# Patient Record
Sex: Male | Born: 1995 | Race: Black or African American | Hispanic: No | Marital: Single | Smoking: Never smoker
Health system: Southern US, Community
[De-identification: ages and names within clinical notes are randomized; demographics above are authoritative.]

## PROBLEM LIST (undated history)

## (undated) DIAGNOSIS — R440 Auditory hallucinations: Secondary | ICD-10-CM

---

## 2018-02-27 ENCOUNTER — Emergency Department (HOSPITAL_COMMUNITY)
Admission: EM | Admit: 2018-02-27 | Discharge: 2018-02-28 | Disposition: A | Discharge status: Home / Self Care | Payer: Self-pay | Attending: Emergency Medicine | Admitting: Emergency Medicine

## 2018-02-27 ENCOUNTER — Encounter (HOSPITAL_COMMUNITY): Payer: Self-pay | Admitting: Emergency Medicine

## 2018-02-27 ENCOUNTER — Emergency Department (HOSPITAL_COMMUNITY): Payer: Self-pay

## 2018-02-27 ENCOUNTER — Other Ambulatory Visit: Payer: Self-pay

## 2018-02-27 DIAGNOSIS — F12259 Cannabis dependence with psychotic disorder, unspecified: Secondary | ICD-10-CM | POA: Insufficient documentation

## 2018-02-27 DIAGNOSIS — F4321 Adjustment disorder with depressed mood: Secondary | ICD-10-CM | POA: Insufficient documentation

## 2018-02-27 DIAGNOSIS — R440 Auditory hallucinations: Secondary | ICD-10-CM | POA: Insufficient documentation

## 2018-02-27 DIAGNOSIS — R4689 Other symptoms and signs involving appearance and behavior: Secondary | ICD-10-CM

## 2018-02-27 LAB — BASIC METABOLIC PANEL
Anion gap: 8 (ref 5–15)
BUN: 10 mg/dL (ref 6–20)
CO2: 22 mmol/L (ref 22–32)
Calcium: 9.6 mg/dL (ref 8.9–10.3)
Chloride: 106 mmol/L (ref 98–111)
Creatinine, Ser: 1.16 mg/dL (ref 0.61–1.24)
GFR calc Af Amer: >60 mL/min (ref >60)
GFR calc non Af Amer: >60 mL/min (ref >60)
Glucose, Bld: 122 mg/dL — ABNORMAL HIGH (ref 70–99)
Potassium: 3.7 mmol/L (ref 3.5–5.1)
Sodium: 136 mmol/L (ref 135–145)

## 2018-02-27 LAB — CBC WITH DIFFERENTIAL/PLATELET
Abs Immature Granulocytes: 0.04 10*3/uL (ref 0.00–0.07)
Basophils Absolute: 0.1 10*3/uL (ref 0.0–0.1)
Basophils Relative: 1 %
Eosinophils Absolute: 0 10*3/uL (ref 0.0–0.5)
Eosinophils Relative: 0 %
HEMATOCRIT: 45.4 % (ref 39.0–52.0)
Hemoglobin: 15.1 g/dL (ref 13.0–17.0)
Immature Granulocytes: 0 %
LYMPHS ABS: 1.4 10*3/uL (ref 0.7–4.0)
Lymphocytes Relative: 14 %
MCH: 28.3 pg (ref 26.0–34.0)
MCHC: 33.3 g/dL (ref 30.0–36.0)
MCV: 85 fL (ref 80.0–100.0)
Monocytes Absolute: 0.5 10*3/uL (ref 0.1–1.0)
Monocytes Relative: 5 %
Neutro Abs: 7.6 10*3/uL (ref 1.7–7.7)
Neutrophils Relative %: 80 %
Platelets: 275 10*3/uL (ref 150–400)
RBC: 5.34 MIL/uL (ref 4.22–5.81)
RDW: 13.2 % (ref 11.5–15.5)
WBC: 9.6 10*3/uL (ref 4.0–10.5)
nRBC: 0 % (ref 0.0–0.2)

## 2018-02-27 LAB — ETHANOL: Alcohol, Ethyl (B): <10 mg/dL (ref <10)
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
## 2018-02-27 NOTE — ED Notes (Signed)
Patient receiving TTS at this time.

## 2018-02-27 NOTE — ED Provider Notes (Signed)
[facility] EMERGENCY DEPARTMENT Provider Note   CSN: [id number] Arrival date & time: 02/27/18  2126     History   Chief Complaint Chief Complaint  Patient presents with  . Altered Mental Status    HPI Adrian Hawkins is a 22 y.o. male.  HPI It is very difficult to get a clear history out of this patient.  Patient initially stated that he felt like he was going crazy.  He has a difficult time elaborating.  Patient states he does use marijuana but no other drugs.  His father wonders whether he may have been slipped something.  I asked the patient if he is hearing any voices after very long pauses he said yes.  When I asked them what the voices are saying the patient stated "believe".  He did not admit to having any thoughts of suicide or homicide.  He could not really answer whether he was feeling depressed.  Patient does not have any history of mental health problems. History reviewed. No pertinent past medical history.  There are no active problems to display for this patient.   History reviewed. No pertinent surgical history.      Home Medications    Prior to Admission medications   Not on File    Family History No family history on file.  Social History Social History   Tobacco Use  . Smoking status: Never Smoker  . Smokeless tobacco: Never Used  Substance Use Topics  . Alcohol use: Yes    Comment: Occasionally   . Drug use: Yes    Types: Marijuana     Allergies   Patient has no allergy information on record.   Review of Systems Review of Systems  All other systems reviewed and are negative.    Physical Exam Updated Vital Signs BP (!) 160/100   Pulse (!) 114   Temp (!) 97.2 F (36.2 C)   Resp (!) 21   Ht 1.753 m (5\' 9" )   Wt 99.8 kg   SpO2 98%   BMI 32.49 kg/m   Physical Exam Vitals signs and nursing note reviewed.  Constitutional:      General: He is not in acute distress.    Appearance: He is well-developed.  HENT:     Head:  Normocephalic and atraumatic.     Right Ear: External ear normal.     Left Ear: External ear normal.  Eyes:     General: No scleral icterus.       Right eye: No discharge.        Left eye: No discharge.     Conjunctiva/sclera: Conjunctivae normal.  Neck:     Musculoskeletal: Neck supple.     Trachea: No tracheal deviation.  Cardiovascular:     Rate and Rhythm: Normal rate and regular rhythm.  Pulmonary:     Effort: Pulmonary effort is normal. No respiratory distress.     Breath sounds: Normal breath sounds. No stridor. No wheezing or rales.  Abdominal:     General: Bowel sounds are normal. There is no distension.     Palpations: Abdomen is soft.     Tenderness: There is no abdominal tenderness. There is no guarding or rebound.  Musculoskeletal:        General: No tenderness.  Skin:    General: Skin is warm and dry.     Findings: No rash.  Neurological:     Mental Status: He is alert.     Cranial Nerves: No cranial nerve deficit (  no facial droop, extraocular movements intact, no slurred speech).     Sensory: No sensory deficit.     Motor: No abnormal muscle tone or seizure activity.     Coordination: Coordination normal.  Psychiatric:        Attention and Perception: He is inattentive.        Mood and Affect: Affect is blunt.        Speech: He is noncommunicative. Speech is delayed.        Behavior: Behavior is slowed and withdrawn.      ED Treatments / Results  Labs (all labs ordered are listed, but only abnormal results are displayed) Labs Reviewed  BASIC METABOLIC PANEL - Abnormal; Notable for the following components:      Result Value   Glucose, Bld 122 (*)    All other components within normal limits  CBC WITH DIFFERENTIAL/PLATELET  ETHANOL  RAPID URINE DRUG SCREEN, HOSP PERFORMED     Radiology pending  Procedures Procedures (including critical care time)  Medications Ordered in ED Medications - No data to display   Initial Impression / Assessment  and Plan / ED Course  I have reviewed the triage vital signs and the nursing notes.  Pertinent labs & imaging results that were available during my care of the patient were reviewed by me and considered in my medical decision making (see chart for details).   Patient presents to the ED for an acute change in his behavior.  Patient is very slow to answer any questions.  Very difficult to get any history out of him.  Question whether he is having any auditory hallucinations and is responding to internal stimuli.  Initial labs are reassuring.  Head CT is pending.  I have requested a psychiatric consultation.  Final Clinical Impressions(s) / ED Diagnoses   Final diagnoses:  Auditory hallucination  Abnormal behavior      Linwood DibblesKnapp, Jacier Gladu, MD 02/28/18 0010

## 2018-02-28 ENCOUNTER — Other Ambulatory Visit: Payer: Self-pay

## 2018-02-28 ENCOUNTER — Encounter (HOSPITAL_COMMUNITY): Payer: Self-pay | Admitting: Registered Nurse

## 2018-02-28 ENCOUNTER — Encounter (HOSPITAL_COMMUNITY): Payer: Self-pay | Admitting: *Deleted

## 2018-02-28 ENCOUNTER — Inpatient Hospital Stay (HOSPITAL_COMMUNITY)
Admission: AD | Admit: 2018-02-28 | Discharge: 2018-03-03 | DRG: 897 | Disposition: A | Discharge status: Home / Self Care | Payer: Federal, State, Local not specified - Other | Attending: Psychiatry | Admitting: Psychiatry

## 2018-02-28 ENCOUNTER — Other Ambulatory Visit: Payer: Self-pay | Admitting: Registered Nurse

## 2018-02-28 DIAGNOSIS — F12259 Cannabis dependence with psychotic disorder, unspecified: Secondary | ICD-10-CM | POA: Diagnosis present

## 2018-02-28 DIAGNOSIS — F329 Major depressive disorder, single episode, unspecified: Secondary | ICD-10-CM | POA: Diagnosis present

## 2018-02-28 DIAGNOSIS — E250 Congenital adrenogenital disorders associated with enzyme deficiency: Secondary | ICD-10-CM | POA: Diagnosis present

## 2018-02-28 DIAGNOSIS — F2081 Schizophreniform disorder: Secondary | ICD-10-CM | POA: Diagnosis not present

## 2018-02-28 DIAGNOSIS — F29 Unspecified psychosis not due to a substance or known physiological condition: Secondary | ICD-10-CM | POA: Diagnosis present

## 2018-02-28 LAB — RAPID URINE DRUG SCREEN, HOSP PERFORMED
Amphetamines: NOT DETECTED
Barbiturates: NOT DETECTED
Benzodiazepines: NOT DETECTED
Cocaine: NOT DETECTED
Opiates: NOT DETECTED
Tetrahydrocannabinol: POSITIVE — AB

## 2018-02-28 MED ORDER — ZIPRASIDONE MESYLATE 20 MG IM SOLR
20.0000 mg | INTRAMUSCULAR | Status: AC | PRN
  Administered 2018-03-01: 20 mg via INTRAMUSCULAR
  Filled 2018-02-28: qty 20

## 2018-02-28 MED ORDER — OLANZAPINE 5 MG PO TBDP
10.0000 mg | ORAL_TABLET | Freq: Three times a day (TID) | ORAL | Status: DC | PRN
  Filled 2018-02-28: qty 2

## 2018-02-28 MED ORDER — LORAZEPAM 1 MG PO TABS
1.0000 mg | ORAL_TABLET | ORAL | Status: DC | PRN
  Filled 2018-02-28: qty 1

## 2018-02-28 MED ORDER — STERILE WATER FOR INJECTION IJ SOLN
INTRAMUSCULAR | Status: AC
  Filled 2018-02-28: qty 10

## 2018-02-28 MED ORDER — OLANZAPINE 5 MG PO TBDP: 5.0000 mg | ORAL_TABLET | Freq: Three times a day (TID) | ORAL | Status: DC | PRN

## 2018-02-28 MED ORDER — LORAZEPAM 1 MG PO TABS: 1.0000 mg | ORAL_TABLET | ORAL | Status: DC | PRN

## 2018-02-28 MED ORDER — ZIPRASIDONE MESYLATE 20 MG IM SOLR
20.0000 mg | Freq: Once | INTRAMUSCULAR | Status: AC
  Administered 2018-02-28: 20 mg via INTRAMUSCULAR
  Filled 2018-02-28: qty 20

## 2018-02-28 MED ORDER — OLANZAPINE 5 MG PO TBDP: 5.0000 mg | ORAL_TABLET | Freq: Every day | ORAL | Status: DC
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
## 2018-02-28 NOTE — ED Notes (Signed)
IVC papers served at this time by [employer].

## 2018-02-28 NOTE — ED Notes (Signed)
Have notified mother that she can talk with for a few mins

## 2018-02-28 NOTE — ED Notes (Signed)
EKG given to Dr

## 2018-02-28 NOTE — ED Provider Notes (Addendum)
9:30 AM Patient is agitated and seems to be progressively worsening. Will try po meds (ativan, zyprexa).  10:00 AM Seems to be getting worse, won't take PO, now much more aggressive. Will give IM geodon  10:24 AM Psychiatry recommends inpatient treatment.  They also recommend Zyprexa 5 mg disintegrating tablet or IM at night.   Pricilla LovelessGoldston, Hakeem Frazzini, MD 02/28/18 1025

## 2018-02-28 NOTE — ED Notes (Signed)
Pt now in custody of [employer]
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
## 2018-02-28 NOTE — ED Notes (Addendum)
Pt is tearful at nurses station. [employer] at bedside as well as security. States he wants to talk to his girlfriend

## 2018-02-28 NOTE — ED Notes (Signed)
[employer] has been called

## 2018-02-28 NOTE — ED Notes (Signed)
[phone] Mother

## 2018-02-28 NOTE — ED Notes (Signed)
Pt made his first 5 minute phone call at this time.

## 2018-02-28 NOTE — ED Notes (Signed)
Per NP, pt is going to be inpatient

## 2018-02-28 NOTE — Progress Notes (Signed)
Pt. meets criteria for inpatient treatment per Assunta FoundShuvon Rankin, NP.  Ref[facility] out to the following hospitals:  [facility]  [facility]  [facility]  [facility]  [facility]  [facility]     Disposition CSW will continue to follow for placement.  Timmothy EulerJean T. Kaylyn LimSutter, MSW, LCSWA Disposition Clinical Social Work [phone] (cell) [phone] (office)

## 2018-02-28 NOTE — ED Notes (Signed)
Officers, PD, and Security at bedside. Pt has finally deescalated

## 2018-02-28 NOTE — Progress Notes (Signed)
[facility]  Shuvon Rankin, NP is the accepting provider.  Malvin JohnsBrian Farah, MD is the attending provider.  Call report to [phone]  Eboni @ [facility] notified.   Pt is IVC .  Pt may be transported by [employer] Pt [facility]  [facility] 14[facility]00  PLE[facility] [phone]  Timmothy EulerJean T. Kaylyn LimSutter, MSW, LCSWA Disposition Clinical Social Work [phone] (cell) [phone] (office)

## 2018-02-28 NOTE — Tx Team (Signed)
Initial Treatment Plan 02/28/2018 7:34 PM Adrian Hawkins [id number]    PATIENT STRESSORS: Marital or family conflict Substance abuse   PATIENT STRENGTHS: Ability for insight Average or above average intelligence Communication skills General fund of knowledge Physical Health   PATIENT IDENTIFIED PROBLEMS: Anxiety "I'm here cause they think I'm crazy" "I get nervous a lot, I can feel my heart beating fast"                     DISCHARGE CRITERIA:  Ability to meet basic life and health needs Improved stabilization in mood, thinking, and/or behavior Verbal commitment to aftercare and medication compliance  PRELIMINARY DISCHARGE PLAN: Attend aftercare/continuing care group  PATIENT/FAMILY INVOLVEMENT: This treatment plan has been presented to and reviewed with the patient, Adrian Hawkins, and/or family member, .  The patient and family have been given the opportunity to ask questions and make suggestions.  Adrian Hawkins, [place], [place] 02/28/2018, 7:34 PM

## 2018-02-28 NOTE — ED Notes (Signed)
TTS in room.  

## 2018-02-28 NOTE — ED Notes (Signed)
Restraints taken off  

## 2018-02-28 NOTE — ED Notes (Signed)
Pt has now been moved to room 17. [employer] at bedside and they will be sitting him throughout the day.

## 2018-02-28 NOTE — ED Notes (Signed)
502 bed 2 Shuvon Pat Kocherankin  Farah MD  [phone]  IVC. Come at 2pm

## 2018-02-28 NOTE — Progress Notes (Signed)
Adult Psychoeducational Group Note  Date:  02/28/2018 Time:  9:00 PM  Group Topic/Focus:  Wrap-Up Group:   The focus of this group is to help patients review their daily goal of treatment and discuss progress on daily workbooks.  Participation Level:  Active  Participation Quality:  Appropriate  Affect:  Appropriate  Cognitive:  Appropriate  Insight: Appropriate  Engagement in Group:  Engaged  Modes of Intervention:  Discussion  Additional Comments: The patient expressed that he rates today a 10.The patient also said that he attended group.  Octavio Mannshigpen, Lynnet Hefley Lee 02/28/2018, 9:00 PM

## 2018-02-28 NOTE — Progress Notes (Signed)
Adrian Hawkins is a 22 year old male pt admitted on involuntary basis. On admission, he reports that he is here because they think I'm crazy and reports that his father brought him into the hospital. He denies SI/HI or A/V hallucinations and reports that he has never been suicidal. He does endorse anxiety and reports that he has anxiety problems. Adrian Hawkins was cooperative during admission process. He did endorse marijuana usage and occasional marijuana usage but denied any other substance use. He reports that he was living with his father but reports that he does not want to go back there when he is discharged. Adrian Hawkins was escorted to the unit, oriented to the milieu and safety maintained.

## 2018-02-28 NOTE — Progress Notes (Signed)
Nurse, Inetta Fermoina, informed of pt disposition due to pt nurse, Jacki ConesLaurie being unavailable.   AC, Kim informed of pt disposition.

## 2018-02-28 NOTE — Progress Notes (Signed)
Pt was observed in the dayroom, attending wrap-up group. Pt appears animated/anxious in affect and mood. Pt denies SI/HI/AVH/Pain at this time. PRNs offered; Pt declined. Pt was assertive with interaction. No behavioral issues noted. Pt was in dayroom seen interacting with peers. Support and encouragement offered. Will continue with POC.

## 2018-02-28 NOTE — BH Assessment (Signed)
Tele Assessment Note   Patient Name: Adrian Hawkins MRN: [id number] Referring Physician: Dr. Lynelle DoctorKnapp Location of Patient: [facility]  Adrian BueLarry Martinovich is an 22 y.o., single male. Pt presented to [facility] voluntarily, accompanied by his father Adrian Bue(Kelvin Rhatigan, [place] [phone]). Pt stated, "I'm here because they think I'm crazy. I feel like I'm crazy. I'm here to prove I'm not crazy." Pt was not truthful in answering the questions for the assessment, but his father contributed additional information regarding pt's current presentation. Per pt, he smokes marijuana daily, up to 5 blunts. Pt denied MH hx, hospitalizations and diagnosis. Pt stated that he has been feeling despondent, isolates, has lost interest in pleasurable things, feels guilt/worthless, has experienced tearfulness, and is angry/irritable. Pt reports no anxiety symptoms. Pt denied AH/VH. Pt father reports that pt laughs at inappropriate times, is often tearful and told him that "God talked to me." Pt father reports symptoms just today. Pt reports having stress related to his child's mother. Pt denies other major life events. Pt reports that he does not remember the events his father is discussing that has happened today. Pt reports having issues with memory.   Pt reports that he lives with his father and other immediate family. Pt reports being unemployed and having no income or insurance. Pt reports having one son. Pt reports no major medical issues. Pt denied legal hx and probation.   Pt oriented to person, place, time and situation. Pt presented alert, dressed appropriately and groomed. Pt spoke clearly, coherently, but seemed confused by questions at time. Pt could have just been guarded about answering, due to father giving a different report. Pt made good eye contact and answered questions appropriately. Pt presented euthymic, calm and mostly open to the assessment process. Pt presented with impairments  of recent memory.   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 No pertinent past medical history.  History reviewed. No pertinent surgical history.  Family History: No family history on file.  Social History:  reports that he has never smoked. He has never used smokeless tobacco. He reports current alcohol use. He reports current drug use. Drug: Marijuana.  Additional Social History:  Alcohol / Drug Use Pain Medications: SEE MAR.  Prescriptions: Pt denied current MH medications.  Over the Counter: SEE MAR.  History of alcohol / drug use?: Yes(THC; 17; Daily; 5 Blunts; Last use 02/27/2018)  CIWA: CIWA-Ar BP: (!) 160/100 Pulse Rate: (!) 114 COWS:    Allergies: Not on File  Home Medications: (Not in a hospital admission)   OB/GYN Status:  No LMP for male patient.  General Assessment Data Location of Assessment: [facility] TTS Assessment: In system Is this a Tele or Face-to-Face Assessment?: Tele Assessment Is this an Initial Assessment or a Re-assessment for this encounter?: Initial Assessment Patient Accompanied by:: Parent(Father- Adrian BueLarry Ottey [phone]) Language Other than English: No Living Arrangements: Other (Comment)(Pt reports that he lives with father, stepmother; sister.) What gender do you identify as?: Male Marital status: Single Maiden name: N/A Pregnancy Status: No Living Arrangements: Parent, Other relatives Can pt return to current living arrangement?: Yes Admission Status: Voluntary Is patient capable of signing voluntary admission?: Yes Referral Source: Self/Family/Friend Insurance type: Self Pay   Medical Screening Exam [facility] Medical Exam completed: Yes  Crisis Care Plan Living Arrangements: Parent, Other relatives Legal Guardian: Other:(Self) Name of Psychiatrist: Denied Name  of Therapist:  Denied  Education Status Is patient currently in school?: No Is the patient employed, unemployed or receiving disability?: Unemployed  Risk to self with the past 6 months Suicidal Ideation: No Has patient been a risk to self within the past 6 months prior to admission? : No Suicidal Intent: No Has patient had any suicidal intent within the past 6 months prior to admission? : No Is patient at risk for suicide?: No Suicidal Plan?: No Has patient had any suicidal plan within the past 6 months prior to admission? : No Access to Means: No What has been your use of drugs/alcohol within the last 12 months?: Denied Previous Attempts/Gestures: No How many times?: 0 Other Self Harm Risks: Denied Triggers for Past Attempts: None known Intentional Self Injurious Behavior: None Family Suicide History: No Recent stressful life event(s): (Denied) Persecutory voices/beliefs?: No Depression: Yes Depression Symptoms: Despondent, Tearfulness, Isolating, Loss of interest in usual pleasures, Feeling worthless/self pity, Feeling angry/irritable Substance abuse history and/or treatment for substance abuse?: No Suicide prevention information given to non-admitted patients: Not applicable  Risk to Others within the past 6 months Homicidal Ideation: No Does patient have any lifetime risk of violence toward others beyond the six months prior to admission? : No Thoughts of Harm to Others: No Current Homicidal Intent: No Current Homicidal Plan: No Access to Homicidal Means: No Identified Victim: Denied History of harm to others?: No Assessment of Violence: None Noted Violent Behavior Description: Denied Does patient have access to weapons?: No Criminal Charges Pending?: No Does patient have a court date: No Is patient on probation?: No  Psychosis Hallucinations: Auditory(Pt denies, but father states possibility. ) Delusions: None noted  Mental Status Report Appearance/Hygiene: Unremarkable Eye  Contact: Good Motor Activity: Unremarkable Speech: Logical/coherent Level of Consciousness: Alert Mood: Ashamed/humiliated Affect: Depressed Anxiety Level: None Thought Processes: Coherent, Relevant Judgement: Partial Orientation: Person, Place, Time, Situation, Appropriate for developmental age Obsessive Compulsive Thoughts/Behaviors: None  Cognitive Functioning Concentration: Normal Memory: Remote Intact, Recent Impaired Is patient IDD: No Insight: Poor Impulse Control: Poor Appetite: Good Have you had any weight changes? : No Change Sleep: No Change Total Hours of Sleep: 6 Vegetative Symptoms: None  ADLScreening [facility] Assessment Services) Patient's cognitive ability adequate to safely complete daily activities?: Yes Patient able to express need for assistance with ADLs?: Yes Independently performs ADLs?: Yes (appropriate for developmental age)  Prior Inpatient Therapy Prior Inpatient Therapy: No  Prior Outpatient Therapy Prior Outpatient Therapy: No Does patient have an ACCT team?: No Does patient have Intensive In-House Services?  : No Does patient have [facility] services? : No Does patient have P4CC services?: No  ADL Screening (condition at time of admission) Patient's cognitive ability adequate to safely complete daily activities?: Yes Is the patient deaf or have difficulty hearing?: No Does the patient have difficulty seeing, even when wearing glasses/contacts?: No Does the patient have difficulty concentrating, remembering, or making decisions?: No Patient able to express need for assistance with ADLs?: Yes Does the patient have difficulty dressing or bathing?: No Independently performs ADLs?: Yes (appropriate for developmental age) Does the patient have difficulty walking or climbing stairs?: No Weakness of Legs: None Weakness of Arms/Hands: None  Home Assistive Devices/Equipment Home Assistive Devices/Equipment: None  Therapy Consults (therapy consults  require a physician order) PT Evaluation Needed: No OT Evalulation Needed: No SLP Evaluation Needed: No Abuse/Neglect Assessment (Assessment to be complete while patient is alone) Abuse/Neglect Assessment Can Be Completed: Yes Physical Abuse: Denies Verbal Abuse: Denies Sexual Abuse:  Denies Exploitation of patient/patient's resources: Denies Self-Neglect: Denies Values / Beliefs Cultural Requests During Hospitalization: None Spiritual Requests During Hospitalization: None Consults Spiritual Care Consult Needed: No Social Work Consult Needed: No Merchant navy officer (For Healthcare) Does Patient Have a Medical Advance Directive?: No Would patient like information on creating a medical advance directive?: No - Patient declined          Disposition: Per Nira Conn, NP; Pt to be held overnight for monitoring of hallucinations. Pt to see Psychiatry in the morning for psychiatric clearance.  Disposition Initial Assessment Completed for this Encounter: Yes  This service was provided via telemedicine using a 2-way, interactive audio and video technology.  Names of all persons participating in this telemedicine service and their role in this encounter. Name: Antoine Vandermeulen, Montez Hageman Role: Patient   Name: Adrian Bue, Sr.  Role: Patient father.   Name: Chesley Noon  Role: Clinician   Name:  Role:    Chesley Noon, M.S., [facility], LCAS Triage Specialist [facility]  02/28/2018 12:03 AM

## 2018-03-01 DIAGNOSIS — F2081 Schizophreniform disorder: Secondary | ICD-10-CM

## 2018-03-01 MED ORDER — GABAPENTIN 400 MG PO CAPS
400.0000 mg | ORAL_CAPSULE | Freq: Three times a day (TID) | ORAL | Status: DC
  Administered 2018-03-02: 400 mg via ORAL
  Filled 2018-03-01 (×7): qty 1

## 2018-03-01 MED ORDER — LORAZEPAM 1 MG PO TABS
2.0000 mg | ORAL_TABLET | Freq: Once | ORAL | Status: AC
  Administered 2018-03-01: 2 mg via ORAL
  Filled 2018-03-01: qty 2

## 2018-03-01 MED ORDER — RISPERIDONE 2 MG PO TABS
4.0000 mg | ORAL_TABLET | Freq: Two times a day (BID) | ORAL | Status: DC
  Administered 2018-03-01 – 2018-03-03 (×5): 4 mg via ORAL
  Filled 2018-03-01 (×7): qty 2

## 2018-03-01 MED ORDER — OMEGA-3-ACID ETHYL ESTERS 1 G PO CAPS
1.0000 g | ORAL_CAPSULE | Freq: Two times a day (BID) | ORAL | Status: DC
  Administered 2018-03-02: 1 g via ORAL
  Filled 2018-03-01 (×8): qty 1

## 2018-03-01 MED ORDER — LORAZEPAM 2 MG/ML IJ SOLN
INTRAMUSCULAR | Status: AC
  Filled 2018-03-01: qty 1

## 2018-03-01 MED ORDER — BENZTROPINE MESYLATE 0.5 MG PO TABS
0.5000 mg | ORAL_TABLET | Freq: Two times a day (BID) | ORAL | Status: DC
  Administered 2018-03-01 – 2018-03-03 (×5): 0.5 mg via ORAL
  Filled 2018-03-01 (×7): qty 1

## 2018-03-01 MED ORDER — PRENATAL MULTIVITAMIN CH
1.0000 | ORAL_TABLET | Freq: Every day | ORAL | Status: DC
  Administered 2018-03-02: 1 via ORAL
  Filled 2018-03-01 (×3): qty 1

## 2018-03-01 MED ORDER — DIPHENHYDRAMINE HCL 50 MG/ML IJ SOLN
INTRAMUSCULAR | Status: AC
  Filled 2018-03-01: qty 1

## 2018-03-01 MED ORDER — LORAZEPAM 2 MG/ML IJ SOLN
2.0000 mg | Freq: Once | INTRAMUSCULAR | Status: AC
  Administered 2018-03-01: 2 mg via INTRAMUSCULAR

## 2018-03-01 MED ORDER — RISPERIDONE 3 MG PO TABS: 3.0000 mg | ORAL_TABLET | Freq: Three times a day (TID) | ORAL | Status: DC

## 2018-03-01 MED ORDER — TEMAZEPAM 15 MG PO CAPS
30.0000 mg | ORAL_CAPSULE | Freq: Every day | ORAL | Status: DC
  Administered 2018-03-01 – 2018-03-02 (×2): 30 mg via ORAL
  Filled 2018-03-01 (×2): qty 2

## 2018-03-01 MED ORDER — DIPHENHYDRAMINE HCL 50 MG/ML IJ SOLN
50.0000 mg | Freq: Once | INTRAMUSCULAR | Status: AC
  Administered 2018-03-01: 50 mg via INTRAMUSCULAR
  Filled 2018-03-01: qty 1

## 2018-03-01 NOTE — Progress Notes (Signed)
D: Pt denies SI/HI/AVH. Pt is pleasant and cooperative and appropriate on the unit this evening. Pt keeping to himself majority of the evening. Pt encouraged to talk to doctor about his Tx and what he needs to do to expedite his departure from here. Pt appeared to comprehend information given  A: Pt was offered support and encouragement. Pt was given scheduled medications. Pt was encourage to attend groups. Q 15 minute checks were done for safety.   R: safety maintained on unit.  Problem: Education: Goal: Emotional status will improve Outcome: Progressing   Problem: Education: Goal: Mental status will improve Outcome: Progressing   Problem: Activity: Goal: Sleeping patterns will improve Outcome: Progressing
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
## 2018-03-01 NOTE — Progress Notes (Signed)
Nursing 1:1 note D:Pt observed sleeping in bed with eyes closed. RR even and unlabored. No distress noted. A: 1:1 observation continues for safety  R: pt remains safe  

## 2018-03-01 NOTE — Tx Team (Signed)
Interdisciplinary Treatment and Diagnostic Plan Update  03/01/2018  Time of Session: Adrian Hawkins MRN: [id number]  Principal Diagnosis: <principal problem not specified>  Secondary Diagnoses: Active Problems:   Psychotic disorder ([place])   Current Medications:  Current Facility-Administered Medications  Medication Dose Route Frequency Provider Last Rate Last Dose  . benztropine (COGENTIN) tablet 0.5 mg  0.5 mg Oral BID Johnn Hai, MD   0.5 mg at 03/01/18 0848  . diphenhydrAMINE (BENADRYL) 50 MG/ML injection           . gabapentin (NEURONTIN) capsule 400 mg  400 mg Oral TID Johnn Hai, MD      . LORazepam (ATIVAN) 2 MG/ML injection           . OLANZapine zydis (ZYPREXA) disintegrating tablet 5 mg  5 mg Oral Q8H PRN Sharma Covert, MD       And  . LORazepam (ATIVAN) tablet 1 mg  1 mg Oral PRN Sharma Covert, MD      . omega-3 acid ethyl esters (LOVAZA) capsule 1 g  1 g Oral BID Johnn Hai, MD      . prenatal multivitamin tablet 1 tablet  1 tablet Oral Q1200 Johnn Hai, MD      . risperiDONE (RISPERDAL) tablet 4 mg  4 mg Oral BID Johnn Hai, MD   4 mg at 03/01/18 0848  . temazepam (RESTORIL) capsule 30 mg  30 mg Oral QHS Johnn Hai, MD       PTA Medications: No medications prior to admission.    Patient Stressors: Marital or family conflict Substance abuse  Patient Strengths: Ability for insight Average or above average intelligence Communication skills General fund of knowledge Physical Health  Treatment Modalities: Medication Management, Group therapy, Case management,  1 to 1 session with clinician, Psychoeducation, Recreational therapy.   Physician Treatment Plan for Primary Diagnosis: <principal problem not specified> Long Term Goal(s): Improvement in symptoms so as ready for discharge Improvement in symptoms so as ready for discharge   Short Term Goals: Ability to identify changes in lifestyle to reduce recurrence of condition will improve Ability  to identify changes in lifestyle to reduce recurrence of condition will improve  Medication Management: Evaluate patient's response, side effects, and tolerance of medication regimen.  Therapeutic Interventions: 1 to 1 sessions, Unit Group sessions and Medication administration.  Evaluation of Outcomes: Not Met  Physician Treatment Plan for Secondary Diagnosis: Active Problems:   Psychotic disorder ([place])  Long Term Goal(s): Improvement in symptoms so as ready for discharge Improvement in symptoms so as ready for discharge   Short Term Goals: Ability to identify changes in lifestyle to reduce recurrence of condition will improve Ability to identify changes in lifestyle to reduce recurrence of condition will improve     Medication Management: Evaluate patient's response, side effects, and tolerance of medication regimen.  Therapeutic Interventions: 1 to 1 sessions, Unit Group sessions and Medication administration.  Evaluation of Outcomes: Not Met   RN Treatment Plan for Primary Diagnosis: <principal problem not specified> Long Term Goal(s): Knowledge of disease and therapeutic regimen to maintain health will improve  Short Term Goals: Ability to verbalize frustration and anger appropriately will improve, Ability to demonstrate self-control, Ability to participate in decision making will improve, Ability to verbalize feelings will improve, Ability to disclose and discuss suicidal ideas, Ability to identify and develop effective coping behaviors will improve and Compliance with prescribed medications will improve  Medication Management: RN will administer medications as ordered by provider, will assess and  evaluate patient's response and provide education to patient for prescribed medication. RN will report any adverse and/or side effects to prescribing provider.  Therapeutic Interventions: 1 on 1 counseling sessions, Psychoeducation, Medication administration, Evaluate responses to  treatment, Monitor vital signs and CBGs as ordered, Perform/monitor CIWA, COWS, AIMS and Fall Risk screenings as ordered, Perform wound care treatments as ordered.  Evaluation of Outcomes: Not Met   LCSW Treatment Plan for Primary Diagnosis: <principal problem not specified> Long Term Goal(s): Safe transition to appropriate next level of care at discharge, Engage patient in therapeutic group addressing interpersonal concerns.  Short Term Goals: Engage patient in aftercare planning with referrals and resources  Therapeutic Interventions: Assess for all discharge needs, 1 to 1 time with Social worker, Explore available resources and support systems, Assess for adequacy in community support network, Educate family and significant other(s) on suicide prevention, Complete Psychosocial Assessment, Interpersonal group therapy.  Evaluation of Outcomes: Not Met   Progress in Treatment: Attending groups: No. Participating in groups: No. Taking medication as prescribed: Yes. Toleration medication: Yes. Family/Significant other contact made: No, will contact:  if patient consents to collateral contacts Patient understands diagnosis: Yes. Discussing patient identified problems/goals with staff: Yes. Medical problems stabilized or resolved: Yes. Denies suicidal/homicidal ideation: Yes. Issues/concerns per patient self-inventory: No. Other:   New problem(s) identified: None   New Short Term/Long Term Goal(s): medication stabilization, elimination of SI thoughts, development of comprehensive mental wellness plan.    Patient Goals:  I'm here cause they think I'm crazy. I get nervous a lot, I can feel my heart beating fast  Discharge Plan or Barriers: Patient is currently being [facility] to [facility] for a higher level of care and continuity of care. CSW will continue to follow for any additional referrals and possible discharge planning.   Reason for Continuation of  Hospitalization: Aggression Depression Medication stabilization Other; describe Psychotic behavior  Estimated Length of Stay: 3-5 days   Attendees: Patient: 03/01/2018 8:59 AM  Physician: Dr. Johnn Hai, MD 03/01/2018 8:59 AM  Nursing: Roderic Palau.Delane Ginger, RN 03/01/2018 8:59 AM  RN Care Manager: 03/01/2018 8:59 AM  Social Worker: Radonna Ricker, [place] 03/01/2018 8:59 AM  Recreational Therapist:  03/01/2018 8:59 AM  Other:  03/01/2018 8:59 AM  Other:  03/01/2018 8:59 AM  Other: 03/01/2018 8:59 AM    Scribe for Treatment Team: Marylee Floras, [place] 03/01/2018 8:59 AM

## 2018-03-01 NOTE — Progress Notes (Signed)
Did not attend group 

## 2018-03-01 NOTE — Progress Notes (Addendum)
   03/01/18 0030  Seclusion/Restraint 15-minute checks  Signs of injury with seclusion/restraint No  Food offered (offer hourly and prn) No (Comment) (Pt asleep )  Fluid offered (offer hourly and prn) No (Comment) (Asleep )  Meets Criteria for Release No  Patient helped to meet criteria for release Yes  Privacy maintained Yes  Monitored 1:1 Yes  Hygiene/Toileting No (Comment) (sleep )  Respiratory Status Easy and unlabored  Circulation/Skin check Normal color  Passive Range of Motion if in 4-point restraints All limbs  Physical status/Comfort Normal movement  Psychological Status/Comfort Alert;Oriented  Behavior Sleeping  Restraint Status 4_point_restraints  Seclusion Status Open_door_seclusion  Manual Hold Status Not applicable   Follow-up: Pt monitored 1:1. Pt remains safe.
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
## 2018-03-01 NOTE — BHH Counselor (Signed)
Pt unable to be assessed due to sleeping/being sedated. CSW will attempt again tomorrow morning.   Qianna Clagett S. Alan RipperHolloway, MSW, LCSW Clinical Social Worker 03/01/2018 4:14 PM

## 2018-03-01 NOTE — Progress Notes (Signed)
Recreation Therapy Notes  Date: 12.18.19 Time: 1000 Location: 500 Hall  Group Topic: Team Building, Problem Solving  Goal Area(s) Addresses:  Patient will effectively work with peer towards shared goal.  Patient will identify skill used to make activity successful.  Patient will identify how skills used during activity can be used to reach post d/c goals.   Intervention: STEM Activity   Activity:  Sharks in the Water.  As a group, each person was given a rubber circle and one extra for the group.  Patients were to move from one end of the hall to the other and back using the rubber discs.  If anyone stepped off of their disc, the group would have to start over.    Education: Social Skills, Discharge Planning.   Education Outcome: Acknowledges education/In group clarification offered/Needs additional education.   Clinical Observations/Feedback: Pt did not attend group.     Tarnisha Kachmar, LRT/CTRS         Aubria Vanecek A 03/01/2018 11:34 AM 
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
## 2018-03-01 NOTE — Progress Notes (Signed)
D: Patient has difficulty swallowing pills. Please consider using Risperdal ODT and other ODT products. Patient observed gagging on risperdal, cogentin and ativan. He refused to take gabapentin, prenatal vitamin and lovaza due to difficulty swallowing.
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
## 2018-03-01 NOTE — Progress Notes (Addendum)
   03/01/18 0400  Seclusion/Restraint 15-minute checks  Signs of injury with seclusion/restraint No  Food offered (offer hourly and prn) Yes  Fluid offered (offer hourly and prn) Yes  Meets Criteria for Release No  Patient helped to meet criteria for release Yes  Privacy maintained Yes  Monitored 1:1 Yes (Art, MHT )  Hygiene/Toileting No (Comment) (urinal left)  Respiratory Status Easy and unlabored  Circulation/Skin check Normal color  Passive Range of Motion if in 4-point restraints All limbs  Physical status/Comfort Normal movement  Psychological Status/Comfort Alert;Oriented  Behavior Lying or sitting;Other (Comment) (accepts vitals )  Restraint Status Not applicable  Seclusion Status Close_door_seclusion (see orders for renewal )  Manual Hold Status Not applicable   Order renewed for closed door seclusion. Pt is not safe to others and self at this time. 1:1 maintained. Will montiitor closely. Pt is on video surveillance. Pt next to NS. Vitals WNL with exception of BP slightly elevated. Pt given fluids/snacks. Urinal provided for Pt. Q 15 checks in place.

## 2018-03-01 NOTE — Progress Notes (Addendum)
   03/01/18 0200  Seclusion/Restraint 15-minute checks  Signs of injury with seclusion/restraint No  Food offered (offer hourly and prn) No - Patient refuses  Fluid offered (offer hourly and prn) No - Patient refuses  Meets Criteria for Release No  Patient helped to meet criteria for release Yes  Privacy maintained Yes  Monitored 1:1 Yes Thayer Ohm(Chris, MHT )  Hygiene/Toileting No - Patient refuses  Respiratory Status Easy and unlabored  Circulation/Skin check Normal color  Passive Range of Motion if in 4-point restraints All limbs  Physical status/Comfort Normal movement  Psychological Status/Comfort Alert;Oriented  Behavior Lying or sitting  Restraint Status Other (Comment) (out of restraint )  Seclusion Status Close_door_seclusion  Manual Hold Status Not applicable  Vital signs for Seclusion/Restraints-Hourly and PRN  Unable to perform vital signs Not applicable   Out of restraints. Closed door seclusion maintained at this time. Monitored 1:1. Pt remains safe.

## 2018-03-01 NOTE — Progress Notes (Signed)
Disposition CSW was requested to provide assistance with [facility] and/or [facility] referral.  [place] and was [facility] referral form completed and submitted to [facility] for authorization.  Patient currently not receiving services through the LME/MCO.  Waiting call back from [employer] to advise Auth# and referral hospital.  CSW advised inpatient CS[facility] at [facility] of status.  Timmothy EulerJean T. Kaylyn LimSutter, MSW, LCSWA Disposition Clinical Social Work [phone] (cell) [phone] (office)

## 2018-03-02 NOTE — B[facility] Counselor (Signed)
Adult Comprehensive Assessment  Patient ID: Adrian Hawkins, male   DOB: 11/07/1995, 22 y.o.   MRN: [id number]  Information Source: Information source: Patient  Current Stressors:  Patient states their primary concerns and needs for treatment are:: "I don't know. My dad thought I was acting crazy."  Patient states their goals for this hospitilization and ongoing recovery are:: "to get home. I feel better now."  Educational / Learning stressors: gradauted high school Employment / Job issues: unemployed--"I don't know when I last had a job."  Family Relationships: lives with dad. Mom in [place], [place]. "I don't really talk to my brother." single/no kids Financial / Lack of resources (include bankruptcy): dad provides for pt financially. no insurance Housing / Lack of housing: [place] Physical health (include injuries & life threatening diseases): none identified Social relationships: poor-limited social network Substance abuse: per father/collateral contact, marijuana daily up to 5 blunts daily. Pt reports less than this--few times a week.  Bereavement / Loss: none identified  Living/Environment/Situation:  Living Arrangements: Parent Living conditions (as described by patient or guardian): house Who else lives in the home?: father How long has patient lived in current situation?: over a year What is atmosphere in current home: Comfortable, Supportive  Family History:  Marital status: Single Are you sexually active?: Yes What is your sexual orientation?: heterosexual Has your sexual activity been affected by drugs, alcohol, medication, or emotional stress?: no Does patient have children?: No  Childhood History:  By whom was/is the patient raised?: Father Additional childhood history information: dad was primary caretaker with help of his family. Pt reports no family history of mental illness/substance abuse.  Description of patient's relationship with  caregiver when they were a child: close to dad; not a strong relationship with mom  Patient's description of current relationship with people who raised him/her: close to dad; good relationship with mom who [place], [place] How were you disciplined when you got in trouble as a child/adolescent?: n/a Does patient have siblings?: Yes Number of Siblings: 1 Description of patient's current relationship with siblings: one little brother "We don't talk that much."  Did patient suffer any verbal/emotional/physical/sexual abuse as a child?: No Did patient suffer from severe childhood neglect?: No Has patient ever been sexually abused/assaulted/raped as an adolescent or adult?: No Was the patient ever a victim of a crime or a disaster?: No Witnessed domestic violence?: No Has patient been effected by domestic violence as an adult?: No  Education:  Highest grade of school patient has completed: graduated high school  Currently a Consulting civil engineerstudent?: No Learning disability?: No  Employment/Work Situation:   Employment situation: Unemployed Patient's job has been impacted by current illness: No(pt has been unemployed for several months. 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 per pt, "I smoke every other day usually and smoke throughout the day." no other drug use. social drinker-2x weekly. no hx binging or blacking out.  If attempted suicide, did drugs/alcohol play a  role in  this?: No Alcohol/Substance Abuse Treatment Hx: Denies past history If yes, describe treatment: n/a  Has alcohol/substance abuse ever caused legal problems?: No  Social Support System:   Patient's Community Support System: Fair Museum/gallery exhibitions officerDescribe Community Support System: some friends; family suppot-dad and extended family Type of faith/religion: n/a  How does patient's faith help to cope with current illness?: n/a   Leisure/Recreation:   Leisure and Hobbies: "I like to play games. Basketball, football."   Strengths/Needs:   What is the patient's perception of their strengths?: friendly; pleasant; insight is improving; future/goal oriented Patient states they can use these personal strengths during their treatment to contribute to their recovery: pt is motivated to continue taking medication for mood stabilization and attend [facility] ([facility]). Patient states these barriers may affect/interfere with their treatment: none identified Patient states these barriers may affect their return to the community: none identified Other important information patient would like considered in planning for their treatment: n/a   Discharge Plan:   Currently receiving community mental health services: No Patient states concerns and preferences for aftercare planning are: [facility] [facility] [place]. Patient states they will know when they are safe and ready for discharge when: "I feel ready to go soon. I'm feeling much better."  Does patient have access to transportation?: Yes(father) Does patient have financial barriers related to discharge medications?: Yes Patient description of barriers related to discharge medications: no income/no insurance. (pt's father may be able to assist).  Will patient be returning to same living situation after discharge?: Yes(pt reports that he can return home. CSW assessing. )  Summary/Recommendations:   Summary and Recommendations (to be  completed by the evaluator): Patient is 22yo male living [place], [place] with his father. He presents to the hospital at the request of his father due to bizarre behavior, heavy marijuana use, mood instability, and for medication stabilization. Pt denies SI/HI/AVH. He has a diagnosis Cannabis Induced Psychotic Disorder. Recommendations for pt include: crisis stabilization, therapeutic milieu, encourage group attendance and participation, medication management for mood stabilization, and development of comprehensive mental wellness/sobriety plan. Pt plans to return home with his father and [facility] [place].   Rona RavensHeather S Dreden Rivere LCSW 03/02/2018 11:33 AM

## 2018-03-02 NOTE — Progress Notes (Signed)
DAR NOTE: Patient presents with calm affect and mood.  Denies suicidal thoughts, pain, auditory and visual hallucinations.  Rates depression at 0, hopelessness at 0, and anxiety at 0.  Maintained on routine safety checks.  Medications given as prescribed but refused Omega 3 and Neurontin.  MD made aware.  Support and encouragement offered as needed. States goal for today is "to go home."  Patient 1:1 observation discontinued.  Offered no complaint.

## 2018-03-02 NOTE — Progress Notes (Signed)
Nursing 1:1 note D:Pt observed sleeping in bed with eyes closed. RR even and unlabored. No distress noted. A: 1:1 observation continues for safety  R: pt remains safe  

## 2018-03-02 NOTE — Progress Notes (Signed)
[facility] Post 1:1 Observation Documentation  For the first (8) hours following discontinuation of 1:1 precautions, a progress note entry by nursing staff should be documented at least every 2 hours, reflecting the patient's behavior, condition, mood, and conversation.  Use the progress notes for additional entries.  Time 1:1 discontinued:  10AM  Patient's Behavior: Patient is calm and cooperative.   Patient's Condition:  Patient is alert and oriented x 3.  Patient's Conversation: Minimal interaction with staff.   Audrie Lializabeth O Shaily Librizzi 03/02/2018, 7:21 PM

## 2018-03-02 NOTE — Progress Notes (Signed)
BHH Post 1:1 Observation Documentation  For the first (8) hours following discontinuation of 1:1 precautions, a progress note entry by nursing staff should be documented at least every 2 hours, reflecting the patient's behavior, condition, mood, and conversation.  Use the progress notes for additional entries.  Time 1:1 discontinued:  10AM  Patient's Behavior:  Patient is calm and cooperative.  Patient's Condition:  Patient is alert and awake.  Visible in milieu watching TV.   Patient's Conversation: Minimal interaction with staff.   Audrie Lializabeth O Leilanny Fluitt 03/02/2018, 5:01 PM

## 2018-03-02 NOTE — Progress Notes (Signed)
[facility]  03/02/2018 9:27 AM Jacklynn BueLarry Albornoz  MRN:  [id number] Subjective:    Patient is seen and he is now alert oriented to person place time and situation not exact date thinks it still the 18th but of course he lost the date due to his illness.  He has recalibrated what appears to be his baseline.  Hopefully his psychosis is temporary and he has no recall of coming in the hospital other than stating that people believe he was crazy.  He acknowledges cannabis dependence and needs to abstain that may have been the cause of this, perhaps even got some synthetic cannabis No EPS or TD Currently denies wanting to harm self or others Short-term memory seems much better Able to answer basic demographic questions  Principal Problem: <principal problem not specified> Diagnosis: Active Problems:   Psychotic disorder (HCC)  Total Time spent with patient: 20 minutes Past Medical History: History reviewed. No pertinent past medical history. History reviewed. No pertinent surgical history. Family History: History reviewed. No pertinent family history. Family Psychiatric  History: pos schiz Social History:  Social History   Substance and Sexual Activity  Alcohol Use Yes   Comment: Occasionally      Social History   Substance and Sexual Activity  Drug Use Yes  . Types: Marijuana    Social History   Socioeconomic History  . Marital status: Single    Spouse name: Not on file  . Number of children: Not on file  . Years of education: Not on file  . Highest education level: Not on file  Occupational History  . Not on file  Social Needs  . Financial resource strain: Not on file  . Food insecurity:    Worry: Not on file    Inability: Not on file  . Transportation needs:    Medical: Not on file    Non-medical: Not on file  Tobacco Use  . Smoking status: Never Smoker  . Smokeless tobacco: Never Used  Substance and Sexual Activity  . Alcohol use: Yes    Comment: Occasionally    . Drug use: Yes    Types: Marijuana  . Sexual activity: Yes    Birth control/protection: None  Lifestyle  . Physical activity:    Days per week: Not on file    Minutes per session: Not on file  . Stress: Not on file  Relationships  . Social connections:    Talks on phone: Not on file    Gets together: Not on file    Attends religious service: Not on file    Active member of club or organization: Not on file    Attends meetings of clubs or organizations: Not on file    Relationship status: Not on file  Other Topics Concern  . Not on file  Social History Narrative  . Not on file   Additional Social History:                         Sleep: Good  Appetite:  Good  Current Medications: Current Facility-Administered Medications  Medication Dose Route Frequency Provider Last Rate Last Dose  . benztropine (COGENTIN) tablet 0.5 mg  0.5 mg Oral BID Malvin JohnsFarah, Florencio Hollibaugh, MD   0.5 mg at 03/02/18 0805  . OLANZapine zydis (ZYPREXA) disintegrating tablet 5 mg  5 mg Oral Q8H PRN Antonieta Pertlary, Greg Lawson, MD       And  . LORazepam (ATIVAN) tablet 1 mg  1  mg Oral PRN Antonieta Pertlary, Greg Lawson, MD      . omega-3 acid ethyl esters (LOVAZA) capsule 1 g  1 g Oral BID Malvin JohnsFarah, Juwan Vences, MD   1 g at 03/02/18 0805  . prenatal multivitamin tablet 1 tablet  1 tablet Oral Q1200 Malvin JohnsFarah, Verl Kitson, MD      . risperiDONE (RISPERDAL) tablet 4 mg  4 mg Oral BID Malvin JohnsFarah, Kip Kautzman, MD   4 mg at 03/02/18 0804  . temazepam (RESTORIL) capsule 30 mg  30 mg Oral QHS Malvin JohnsFarah, Caroline Matters, MD   30 mg at 03/01/18 2134    Lab Results: No results found for this or any previous visit (from the past 48 hour(s)).  Blood Alcohol level:  Lab Results  Component Value Date   ETH <10 02/27/2018    Metabolic Disorder Labs: No results found for: HGBA1C, MPG No results found for: PROLACTIN No results found for: CHOL, TRIG, HDL, CHOLHDL, VLDL, LDLCALC  Physical Findings: AIMS: Facial and Oral Movements Muscles of Facial Expression: None,  normal Lips and Perioral Area: None, normal Jaw: None, normal Tongue: None, normal,Extremity Movements Upper (arms, wrists, hands, fingers): None, normal Lower (legs, knees, ankles, toes): None, normal, Trunk Movements Neck, shoulders, hips: None, normal, Overall Severity Severity of abnormal movements (highest score from questions above): None, normal Incapacitation due to abnormal movements: None, normal Patient's awareness of abnormal movements (rate only patient's report): No Awareness, Dental Status Current problems with teeth and/or dentures?: No Does patient usually wear dentures?: No  CIWA:  CIWA-Ar Total: 0 COWS:  COWS Total Score: 1  Musculoskeletal: Strength & Muscle Tone: within normal limits Gait & Station: normal Patient leans: N/A  Psychiatric Specialty Exam: Physical Exam  ROS  Blood pressure 121/76, pulse (!) 136, temperature 98.7 F (37.1 C), temperature source Oral, resp. rate 16, height 5\' 10"  (1.778 m), weight 96.6 kg, SpO2 93 %.Body mass index is 30.56 kg/m.  General Appearance: Casual  Eye Contact:  Good  Speech:  Clear and Coherent  Volume:  Normal  Mood:  Euthymic  Affect:  Appropriate  Thought Process:  Goal Directed  Orientation:  Full (Time, Place, and Person)  Thought Content:  Logical  Suicidal Thoughts:  No  Homicidal Thoughts:  No  Memory:  Immediate;   Good  Judgement:  Good  Insight:  Good  Psychomotor Activity:  Normal  Concentration:  Concentration: Good  Recall:  Good  Fund of Knowledge:  Good  Language:  Good  Akathisia:  Negative  Handed:  Right  AIMS (if indicated):     Assets:  Communication Skills Desire for Improvement  ADL's:  Intact  Cognition:  WNL  Sleep:  Number of Hours: 9.75   For drug-induced psychosis continue to seek diagnostic clarity rule out schizophreniform condition continue neuro protective measures, at his request discontinue Neurontin as he has no anxiety at this point, for psychosis continue Risperdal  probable discharge tomorrow  Treatment Plan Summary: Daily contact with patient to assess and evaluate symptoms and progress in treatment and Medication management  Belissa Kooy, MD 03/02/2018, 9:27 AM

## 2018-03-02 NOTE — Progress Notes (Signed)
[facility] Post 1:1 Observation Documentation  For the first (8) hours following discontinuation of 1:1 precautions, a progress note entry by nursing staff should be documented at least every 2 hours, reflecting the patient's behavior, condition, mood, and conversation.  Use the progress notes for additional entries.  Time 1:1 discontinued:  10AM  Patient's Behavior:  Patient is calm and cooperative.  Patient's Condition:  Alert and awake.  Patient currently in his room.  Patient's Conversation:  No interaction with staff or peers.  Adrian Hawkins 03/02/2018, 3:13 PM

## 2018-03-02 NOTE — Progress Notes (Signed)
Nursing 1:1 note D:Pt observed sitting in room . RR even and unlabored. No distress noted.Pt used the bathroom at 555  A: 1:1 observation continues for safety  R: pt remains safe

## 2018-03-02 NOTE — Progress Notes (Signed)
Pt has been sitting in the dayroom all evening at the back of the room, watching TV with some casual interaction with other patients.  He reports his day has been fine.  He denies SI/HI/AVH.  He voiced no needs or concerns.  He denies SI/HI/AVH, although he does seem to be responding at times.  He took his scheduled medications with some reluctance, but did take all of them.  Pt cooperated with shift assessment.  Support and encouragement offered.  Discharge plans are in process.  Pt plans to go back to his father's home at discharge.  Safety maintained with q15 minute checks.

## 2018-03-02 NOTE — Progress Notes (Signed)
Recreation Therapy Notes  Date: 12.19.19 Time: 1000 Location:  500 Hall Dayroom  Group Topic: Communication, Team Building, Problem Solving  Goal Area(s) Addresses:  Patient will effectively work with peer towards shared goal.  Patient will identify skills used to make activity successful.  Patient will identify how skills used during activity can be used to reach post d/c goals.   Behavioral Response: Engaged  Intervention: STEM Activity  Activity: Stage managerLanding Pad. In teams patients were given 12 plastic drinking straws and a length of masking tape. Using the materials provided patients were asked to build a landing pad to catch a golf ball dropped from approximately 6 feet in the air.   Education: Pharmacist, communityocial Skills, Discharge Planning   Education Outcome: Acknowledges education/In group clarification offered/Needs additional education.   Clinical Observations/Feedback: Pt was actively engaged in the activity.  Pt worked well with his peer.  Pt was pleasant but stated on many occassions he didn't think the activity was possible.  At the end of group, LRT explained an easier way the activity could have been completed.  Pt realized he had over thought the activity and that the easier way never crossed his mind.    Caroll RancherMarjette Bernal Luhman, LRT/CTRS     Caroll RancherLindsay, Lashauna Arpin A 03/02/2018 11:39 AM

## 2018-03-02 NOTE — [facility] Suicide Risk Assessment (Addendum)
[facility] INPATIENT:  Family/Significant Other Suicide Prevention Education  Suicide Prevention Education:  Contact Attempts: Adrian BueLarry Esquivias Sr. (pt's father) [phone] has been identified by the patient as the family member/significant other with whom the patient will be residing, and identified as the person(s) who will aid the patient in the event of a mental health crisis.  With written consent from the patient, two attempts were made to provide suicide prevention education, prior to and/or following the patient's discharge.  We were unsuccessful in providing suicide prevention education.  A suicide education pamphlet was given to the patient to share with family/significant other.  Date and time of first attempt: 03/02/18 at 2:52PM (HIPPA compliant voicemail left requesting call back at his earliest convenience).   Rona RavensHeather S Braelyn Bordonaro LCSW 03/02/2018, 2:53 PM   SPE and aftercare reviewed with pt's father. Pt's father has no safety concerns regarding pt returning home at discharge and has no issue with pt discharging on Friday, 12/20.  Adrian Hawkins S. Adrian Hawkins, MSW, LCSW Clinical Social Worker 03/02/2018 3:11 PM

## 2018-03-02 NOTE — Progress Notes (Signed)
CSW [facility] to take pt off waitlist, as he is much improved and [facility]   Ethel RanaHeather S. Alan RipperHolloway, MSW, LCSW Clinical Social Worker 03/02/2018 11:17 AM

## 2018-03-02 NOTE — Progress Notes (Signed)
[facility] Post 1:1 Observation Documentation  For the first (8) hours following discontinuation of 1:1 precautions, a progress note entry by nursing staff should be documented at least every 2 hours, reflecting the patient's behavior, condition, mood, and conversation.  Use the progress notes for additional entries.  Time 1:1 discontinued:  10AM  Patient's Behavior:  Patient is calm and cooperative.  Patient's Condition: Patient is in his room.    Patient's Conversation: No interaction with staff or peers.   Adrian Hawkins 03/02/2018, 3:15 PM

## 2018-03-02 NOTE — Progress Notes (Signed)
Adult Psychoeducational Group Note  Date:  03/02/2018 Time:  8:47 PM  Group Topic/Focus:  Wrap-Up Group:   The focus of this group is to help patients review their daily goal of treatment and discuss progress on daily workbooks.  Participation Level:  Active  Participation Quality:  Appropriate  Affect:  Appropriate  Cognitive:  Appropriate  Insight: Appropriate  Engagement in Group:  Engaged  Modes of Intervention:  Education  Additional Comments:  The patient expressed that he rates today a 10.The patient also said that he attended groups.  Octavio Mannshigpen, Braley Luckenbaugh Lee 03/02/2018, 8:47 PM

## 2018-03-02 NOTE — Progress Notes (Signed)
[facility] with [facility] and [facility].   [facility] [phone]#[id number].  CSW called in and reported demographic information to Aurther Lofterry, in Intake and Ad[facility]. [facility] for [facility].  [phone]) Disposition CSW will continue to follow for placement and will continue to advise inpatient CSW on status.  Timmothy EulerJean T. Kaylyn LimSutter, MSW, LCSWA Disposition Clinical Social Work [phone] (cell) [phone] (office)

## 2018-03-03 MED ORDER — ENLYTE PO CAPS: ORAL_CAPSULE | ORAL | 5 refills | Status: AC

## 2018-03-03 MED ORDER — RISPERIDONE 4 MG PO TABS: 4.0000 mg | ORAL_TABLET | Freq: Every day | ORAL | 0 refills | Status: AC

## 2018-03-03 MED ORDER — TEMAZEPAM 30 MG PO CAPS: 30.0000 mg | ORAL_CAPSULE | Freq: Every evening | ORAL | 0 refills | Status: AC | PRN

## 2018-03-03 MED ORDER — OMEGA-3-ACID ETHYL ESTERS 1 G PO CAPS: 1.0000 g | ORAL_CAPSULE | Freq: Two times a day (BID) | ORAL | 5 refills | Status: AC

## 2018-03-03 NOTE — Progress Notes (Signed)
Patient ID: Adrian Hawkins, male   DOB: 12/06/1995, 22 y.o.   MRN: [id number]  Pt currently presents with a pleasant affect and cooperative behavior. Pt reports to writer that their goal is to "go home." Pt states "I fell much better." Pt reports good sleep with current medication regimen.   Pt provided with medications per providers orders. Pt's labs and vitals were monitored throughout the night. Pt supported emotionally and encouraged to express concerns and questions. Pt educated on medications. Pt has a plan to stay off drugs when he leaves.   Pt's safety ensured with 15 minute and environmental checks. Pt currently denies SI/HI and A/V hallucinations. Pt verbally agrees to seek staff if SI/HI or A/VH occurs and to consult with staff before acting on any harmful thoughts. Pt to be discharged today per MD.  Will continue POC.

## 2018-03-03 NOTE — [facility] Suicide Risk Assessment (Signed)
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  Patients presenting with no risk factors but with morbid ruminations; may be classified as minimal risk based on the severity of the depressive symptoms  Follow-up Information    [facility]. Go on 03/06/2018.   Why:  Your hospital follow up appointment is Monday, 03/06/18 at 9:30a. Please bring your photo ID,proof of insurance, social security card, and any discharge paperwork from this hospitalization.  Contact information: [address] [place] [id number] [phone]           Plan Of Care/Follow-up recommendations:  Activity:  nl  Malvin JohnsFARAH,Jonanthony Nahar, MD 03/03/2018, 7:41 AM

## 2018-03-03 NOTE — Progress Notes (Signed)
Patient ID: Adrian Hawkins, male   DOB: 05/20/1995, 22 y.o.   MRN: [id number]  Pt discharged to lobby. Pt was stable and appreciative at that time. All papers and prescriptions were given and valuables returned. Verbal understanding expressed. Denies SI/HI and A/VH. Pt given opportunity to express concerns and ask questions.

## 2018-03-03 NOTE — Progress Notes (Signed)
  [facility] Discharge [facility] :  Will you be returning to the same living situation after discharge:  Yes,  home At discharge, do you have transportation home?: Yes,  father Do you have the ability to pay for your medications: Yes,  mental health  Release of information consent forms completed and submitted to medical records by CSW.   Patient to Follow up at: Follow-up Information    [facility]. Go on 03/06/2018.   Why:  Your hospital follow up appointment is Monday, 03/06/18 at 9:30a. Please bring your photo ID,proof of insurance, social security card, and any discharge paperwork from this hospitalization.  Contact information: [address] [place] [id number] [phone]           Next level of care provider has access to [facility] Link:no  Safety Planning and Suicide Prevention discussed: Yes,  SPE completed with pt and his father. SPI pamphlet and mobile crisis information provided to pt.  Have you used any form of tobacco in the last 30 days? (Cigarettes, Smokeless Tobacco, Cigars, and/or Pipes): No  Has patient been referred to the Quitline?: N/A patient is not a smoker  Patient has been referred for addiction treatment: Yes  Rona RavensHeather S Jumar Greenstreet, LCSW 03/03/2018, 9:13 AM
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
## 2018-03-03 NOTE — Progress Notes (Signed)
Recreation Therapy Notes  Date: 12.20.19 Time: 1000 Location: 500 Hall Dayroom  Group Topic: Triggers  Goal Area(s) Addresses:  Patient will identify triggers. Patient will identify strategies to avoid triggers. Patient will identify strategies to deal with triggers head on.   Behavioral Response: Engaged  Intervention: Worksheet  Activity: Triggers.  Patients were to identify the things that trigger them, how they can avoid their triggers and how they can face their triggers head on.  Education: Communication, Discharge Planning  Education Outcome: Acknowledges understanding/In group clarification offered/Needs additional education.   Clinical Observations/Feedback: Pt identified his triggers as "bad energy, people starting rumors and bullies".  Pt stated he avoided his triggers by "staying away, being around positive energy and give things back to people who have things taken from them".  Pt expressed he faced his triggers by "diving in and deal with it".     Caroll RancherMarjette Cailie Bosshart, LRT/CTRS     Caroll RancherLindsay, Nicoletta Hush A 03/03/2018 10:39 AM

## 2018-04-14 ENCOUNTER — Encounter (HOSPITAL_COMMUNITY): Payer: Self-pay

## 2018-04-14 ENCOUNTER — Other Ambulatory Visit: Payer: Self-pay

## 2018-04-14 ENCOUNTER — Emergency Department (HOSPITAL_COMMUNITY)
Admission: EM | Admit: 2018-04-14 | Discharge: 2018-04-19 | Disposition: A | Discharge status: Home / Self Care | Payer: Self-pay | Attending: Emergency Medicine | Admitting: Emergency Medicine

## 2018-04-14 DIAGNOSIS — Z79899 Other long term (current) drug therapy: Secondary | ICD-10-CM | POA: Insufficient documentation

## 2018-04-14 DIAGNOSIS — R4689 Other symptoms and signs involving appearance and behavior: Secondary | ICD-10-CM | POA: Insufficient documentation

## 2018-04-14 DIAGNOSIS — R454 Irritability and anger: Secondary | ICD-10-CM

## 2018-04-14 DIAGNOSIS — Z62810 Personal history of physical and sexual abuse in childhood: Secondary | ICD-10-CM | POA: Insufficient documentation

## 2018-04-14 DIAGNOSIS — R440 Auditory hallucinations: Secondary | ICD-10-CM | POA: Insufficient documentation

## 2018-04-14 HISTORY — DX: Auditory hallucinations: R44.0

## 2018-04-14 LAB — COMPREHENSIVE METABOLIC PANEL
ALBUMIN: 4.7 g/dL (ref 3.5–5.0)
ALT: 37 U/L (ref 0–44)
AST: 27 U/L (ref 15–41)
Alkaline Phosphatase: 64 U/L (ref 38–126)
Anion gap: 10 (ref 5–15)
BUN: 7 mg/dL (ref 6–20)
CO2: 21 mmol/L — ABNORMAL LOW (ref 22–32)
Calcium: 9.7 mg/dL (ref 8.9–10.3)
Chloride: 106 mmol/L (ref 98–111)
Creatinine, Ser: 1.07 mg/dL (ref 0.61–1.24)
GFR calc Af Amer: >60 mL/min (ref >60)
GFR calc non Af Amer: >60 mL/min (ref >60)
GLUCOSE: 100 mg/dL — AB (ref 70–99)
Potassium: 3.9 mmol/L (ref 3.5–5.1)
Sodium: 137 mmol/L (ref 135–145)
Total Bilirubin: 0.7 mg/dL (ref 0.3–1.2)
Total Protein: 8.6 g/dL — ABNORMAL HIGH (ref 6.5–8.1)

## 2018-04-14 LAB — RAPID URINE DRUG SCREEN, HOSP PERFORMED
Amphetamines: NOT DETECTED
Barbiturates: NOT DETECTED
Benzodiazepines: NOT DETECTED
Cocaine: NOT DETECTED
Opiates: NOT DETECTED
Tetrahydrocannabinol: POSITIVE — AB

## 2018-04-14 LAB — CBC WITH DIFFERENTIAL/PLATELET
Abs Immature Granulocytes: 0.03 10*3/uL (ref 0.00–0.07)
Basophils Absolute: 0.1 10*3/uL (ref 0.0–0.1)
Basophils Relative: 1 %
Eosinophils Absolute: 0.2 10*3/uL (ref 0.0–0.5)
Eosinophils Relative: 2 %
HCT: 46.6 % (ref 39.0–52.0)
Hemoglobin: 15.4 g/dL (ref 13.0–17.0)
Immature Granulocytes: 0 %
Lymphocytes Relative: 26 %
Lymphs Abs: 2.2 10*3/uL (ref 0.7–4.0)
MCH: 28.2 pg (ref 26.0–34.0)
MCHC: 33 g/dL (ref 30.0–36.0)
MCV: 85.2 fL (ref 80.0–100.0)
Monocytes Absolute: 0.6 10*3/uL (ref 0.1–1.0)
Monocytes Relative: 7 %
NEUTROS PCT: 64 %
Neutro Abs: 5.6 10*3/uL (ref 1.7–7.7)
Platelets: 227 10*3/uL (ref 150–400)
RBC: 5.47 MIL/uL (ref 4.22–5.81)
RDW: 13.2 % (ref 11.5–15.5)
WBC: 8.6 10*3/uL (ref 4.0–10.5)
nRBC: 0 % (ref 0.0–0.2)

## 2018-04-14 LAB — ETHANOL: Alcohol, Ethyl (B): <10 mg/dL (ref <10)

## 2018-04-14 MED ORDER — DIPHENHYDRAMINE HCL 25 MG PO CAPS
50.0000 mg | ORAL_CAPSULE | Freq: Once | ORAL | Status: AC
  Administered 2018-04-14: 50 mg via ORAL
  Filled 2018-04-14: qty 2

## 2018-04-14 MED ORDER — LORAZEPAM 0.5 MG PO TABS
0.5000 mg | ORAL_TABLET | Freq: Once | ORAL | Status: AC
  Administered 2018-04-14: 0.5 mg via ORAL
  Filled 2018-04-14: qty 1

## 2018-04-14 NOTE — Progress Notes (Signed)
Pt meets inpatient criteria per Reola Calkins, NP. Referral information has been sent to the following hospitals for review:  [facility]  [facility]  [facility]  [facility]  [facility]  [facility]  [facility]   Disposition will continue to assist with inpatient placement needs.   Wells Guiles, LCSW, LCAS Disposition CSW [facility]/TTS [phone]
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
## 2018-04-14 NOTE — ED Notes (Signed)
Pt refused labs earlier
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
## 2018-04-14 NOTE — ED Notes (Addendum)
Tele-sitter notified that camera had been placed with pt and report given to them. Gearldine Bienenstock, RN notified.

## 2018-04-14 NOTE — ED Notes (Signed)
Pt and mother informed that [facility] was recommending inpatient placement. Pt dressed out into [facility] scrubs. Pt's clothing and shoes placed in bags and put into lockers.

## 2018-04-14 NOTE — ED Notes (Signed)
Pt's mother getting upset for the long wait, she has mentioned about leaving and going to [facility] twice, EDP made aware.

## 2018-04-15 MED ORDER — TEMAZEPAM 15 MG PO CAPS
30.0000 mg | ORAL_CAPSULE | Freq: Every evening | ORAL | Status: DC | PRN
  Administered 2018-04-19: 30 mg via ORAL
  Filled 2018-04-15 (×3): qty 2

## 2018-04-15 MED ORDER — ZIPRASIDONE MESYLATE 20 MG IM SOLR
20.0000 mg | Freq: Once | INTRAMUSCULAR | Status: AC
  Administered 2018-04-16: 20 mg via INTRAMUSCULAR
  Filled 2018-04-15: qty 20

## 2018-04-15 MED ORDER — RISPERIDONE 1 MG PO TABS
4.0000 mg | ORAL_TABLET | Freq: Every day | ORAL | Status: DC
  Administered 2018-04-16: 4 mg via ORAL
  Administered 2018-04-17: 1 mg via ORAL
  Administered 2018-04-19: 4 mg via ORAL
  Filled 2018-04-15 (×6): qty 4

## 2018-04-15 MED ORDER — OMEGA-3-ACID ETHYL ESTERS 1 G PO CAPS
1.0000 g | ORAL_CAPSULE | Freq: Two times a day (BID) | ORAL | Status: DC
  Administered 2018-04-15 – 2018-04-19 (×8): 1 g via ORAL
  Filled 2018-04-15 (×14): qty 1

## 2018-04-15 NOTE — ED Notes (Signed)
Patient refused to speak. Stated "I don't want to talk" when I attempted shift suicide assessment.

## 2018-04-15 NOTE — ED Notes (Signed)
Dr Jacqulyn Bath is aware of patient's behavior towards staff and other patients, and refusal to take medications.

## 2018-04-15 NOTE — BH Assessment (Signed)
Reassessment--Reassessed pt who states that he is back on his medication and has been sleeping ok and resting in the ED. Pt now denies SI, HI, AVH, and states that he feels better and realizes that he needs to be on medication. He states that [facility] and [facility] not follow up with medications/treatment, he did not think he needed them, but realizes now that he does. Pt was cooperative during assessment with good eye contact, oriented x 4, normal movement, casual appearance. Per [name], NP, TTS will continue to seek placement for pt and if pt is not placed by 2/2, psychiatry will re-evaluate for IP criteria. Pt [facility]: [facility]     [facility]   [facility]   [facility]   [facility]   [facility]   [facility]

## 2018-04-15 NOTE — ED Notes (Signed)
Pt refused to allow vital signs to be taken, comfort measures provided, pt is rolled on left side, repeating " I dont need nothing" ,

## 2018-04-15 NOTE — ED Notes (Signed)
Patient refuses to take any medications prescribed. Stated there is no reason he doesn't want them. Caitey, RN encouraged pt to take meds to relax and sleep. Patient stated "I don't need sleep."

## 2018-04-15 NOTE — ED Notes (Signed)
Pt is speaking to mom via telephone.  Pt has been very cooperative.

## 2018-04-15 NOTE — ED Notes (Signed)
Pt took shower.  Pt has been very calm and cooperative this am.

## 2018-04-15 NOTE — ED Notes (Signed)
Another patient stated to me that Damine was making comments stating "You good bro, you good?". The concerned patient was in a hallway bed in front of Enoch's room and requested to be moved so that Daven could not see him. We moved that patient's bed to the other side of the hallway where Jahziel could not see him.  While moving hallway bed, Kodey stared at other patient and mumbled aggressively. Patient exhibiting anger.
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
## 2018-04-15 NOTE — ED Notes (Addendum)
Patient's father called to check on pt. I told him that pt cannot except any more phone calls at this time. I also told him that pt was acting aggressively towards other patients and that was another reason that he could not accept calls at this time.

## 2018-04-15 NOTE — ED Notes (Addendum)
Patient getting very agitated stating he wants to talk to his mother. I gave pt portable phone and patient spoke with his mother. Patient starting crying on the phone with his mom and came outside his room to nurse's station to handed me the phone. Pt's mom wanted an update on son. I told pt's mom that we were still waiting on bed placement or a reevaluation tomorrow. Pt thought I was telling his mom that he got out of bed and started yelling "I did not get out of the bed! Why are you telling her that?" Sarah, RN, explained what bed placement meant. Pt was told to go back in his room. Pt exhibiting anger and frustration. Pt is still directable at this time. Told pt's mom we would try to call her if he got a bed tonight.

## 2018-04-15 NOTE — ED Notes (Signed)
Mother's number: [phone]
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
## 2018-04-15 NOTE — ED Notes (Signed)
Pt sleeping. avasyst monitoring

## 2018-04-15 NOTE — ED Notes (Signed)
TTS in progress 

## 2018-04-16 MED ORDER — LORAZEPAM 1 MG PO TABS
1.0000 mg | ORAL_TABLET | Freq: Two times a day (BID) | ORAL | Status: AC | PRN
  Administered 2018-04-16 – 2018-04-18 (×4): 1 mg via ORAL
  Filled 2018-04-16 (×4): qty 1

## 2018-04-16 MED ORDER — ONDANSETRON 8 MG PO TBDP
8.0000 mg | ORAL_TABLET | Freq: Once | ORAL | Status: AC
  Administered 2018-04-16: 8 mg via ORAL
  Filled 2018-04-16: qty 1

## 2018-04-16 NOTE — Progress Notes (Addendum)
CSW contacted facilities regarding referrals with the following results:  Still reviewing:  [facility] (no beds today)  Declined:  [facility] (no non-geriatric males until April) [facility] (at capacity) [facility] (due to lack of beds for those without insurance) [facility] (not specified)  Patient information was sent to the following facilities as well:  [facility]  TTS will continue to seek placement.  Vilma Meckel. Algis Greenhouse, MSW, LCSW Clinical Social Work/Disposition Phone: [phone] Fax: [phone]
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
## 2018-04-16 NOTE — ED Notes (Signed)
Meal provided 

## 2018-04-16 NOTE — ED Notes (Signed)
Pt pacing in room and asked pt in room 17 "do you want to fight, I haven't seen you in forever. Do you want to fight, I fight like a man" Security intervened and asked this pt and pt in room 17 to turn around and not say anything to each other

## 2018-04-16 NOTE — ED Notes (Signed)
Pt refused to take all of his nighttime medications, stated that he would only take some of them,

## 2018-04-16 NOTE — ED Notes (Signed)
Coffee for pt   Discussed super bowl tonight  How our teams are not in the game   Pt continues polite and calm

## 2018-04-16 NOTE — ED Notes (Signed)
Out of bed to BR 

## 2018-04-16 NOTE — ED Notes (Signed)
Introduced self to pt  Snacks provided   Awaiting bed placement

## 2018-04-16 NOTE — ED Notes (Signed)
Pt has been cooperative all day   Stating he wanted to leave once due to delay in placement- he was appeased when informed of intervention by [facility] at bed meeting and belief of bed availability in the am   Upon night time meds, pt extremely reluctant to take four tablets of risperidone 1 mg. He requested  med info sheet which was provided  He agreed to take one tablet (resperidone 1 mg) and declined the other three tabs which were wasted in the sink with Kristen Cardinal, RN witness- pt lights dimmed and he is encouraged to rest with sleep is restorative to brain and sleep benefits shared with pt-

## 2018-04-16 NOTE — BHH Counselor (Signed)
Pt was reassessed.  Pt denied SI, HI, and AVH.  He stated that he feels better now that he is on medication, and he would like to be discharged.  Asked about follow-up for provider.  Pt stated he does not yet have a provider scheduled.   Reassessed pt who states that he is back on his medication and has been sleeping ok and resting in the ED. Pt now denies SI, HI, AVH, and states that he feels better and realizes that he needs to be on medication. He states that when he left [facility] and did not follow up with medications/treatment, he did not think he needed them, but realizes now that he does. Pt was cooperative during assessment with good eye contact, oriented x 4, normal movement, casual appearance. Per Reola Calkins, NP, TTS will continue to seek placement for pt and if pt is not placed by 2/2, psychiatry will re-evaluate for IP criteria.

## 2018-04-16 NOTE — ED Notes (Signed)
Spoke w pt explained that his feelings of frustrated have been sharerd with [place], EDP as well as [facility] placement  Pt accepts meds and appears encouraged that he will be mentioned again to other facilities as well as has been heard in his voice of frustrations  (Pt has been here 50 hours)

## 2018-04-16 NOTE — ED Notes (Signed)
Pt informed that [facility], Tim, RN has spoken with [facility] and is reassured that they feel that beds will be open in the am

## 2018-04-16 NOTE — ED Notes (Signed)
Pt states that the nausea is better, pt sitting up in bed, cooperative at present, sitter at bedside,

## 2018-04-16 NOTE — ED Notes (Signed)
Call to Dr Hyacinth Meeker, he will order something for pt discomforture

## 2018-04-16 NOTE — ED Notes (Signed)
Watching the superbowl

## 2018-04-16 NOTE — ED Notes (Signed)
Pt given crackers, peanut butter, and jello

## 2018-04-16 NOTE — ED Notes (Signed)
Pt wandering out of the room into the hallway, pt redirected back to room,

## 2018-04-16 NOTE — ED Notes (Signed)
Tele sitter called and advised that pt had unplugged the monitor, monitor plugged back in, pt instructed not to touch the monitor,

## 2018-04-16 NOTE — ED Notes (Signed)
Pt reports that he does not wish to stay  When questioned further, pt is tired of awaiting an inpt placement   Reports he wants his meds adjusted, knows he needs help but feels that he has not been a prioroty for placement  Call to [facility] and asked if they would resubmit his paperwork or inquire re placement  He is concerned with length of stay in ED

## 2018-04-16 NOTE — ED Notes (Signed)
Pt sitting on the side of the bed, c/o loud heaving and nausea, Dr Rhunette Croft at bedside, additional orders given,

## 2018-04-16 NOTE — ED Notes (Signed)
Pt showering at this time

## 2018-04-16 NOTE — ED Notes (Signed)
Received call from telesitter who advised that pt had gotten up and urinated in a cup in the room and then poured the cup down the sink,

## 2018-04-17 NOTE — ED Notes (Signed)
Pt asking RN "why am I here?" RN told pt that we were still waiting on a bed to open up at a behavioral health hospital to transport him there. Pt looked confused. RN asked pt if he remembers staff talking to him about this over the last few days. Pt shakes his head, rubs his face and says he doesn't have any other questions.

## 2018-04-17 NOTE — ED Notes (Signed)
Pt refused to take all scheduled dose of Risperdal. Pt took only 1 of 4 tablets.

## 2018-04-17 NOTE — ED Notes (Signed)
Pt walking out of room and going towards the inpatient doors of the hospital. Pt's name called, but pt doesn't respond to this RN. Tech walked up to the patient and starting talking to him and got him to come back to his room. Dr. Juleen China notified of pt's behavior.

## 2018-04-17 NOTE — Progress Notes (Signed)
Patient continues to meet IP criteria per Hillery Jacks, NP.  Timmothy Euler. Kaylyn Lim, MSW, LCSWA Disposition Clinical Social Work [phone] (cell) [phone] (office)

## 2018-04-18 NOTE — ED Notes (Signed)
TTS machine at bedside. 

## 2018-04-18 NOTE — ED Notes (Signed)
Pt woke up and stated he was hungry. Pt provided a Sprite and meal to eat. Pt remains calm and cooperative.

## 2018-04-18 NOTE — Progress Notes (Signed)
Pt. Continues to meet criteria for inpatient treatment per Assunta Found, NP.  Referred out to the following hospitals:  [facility]  [facility]  [facility]  [facility]  [facility]  [facility]  [facility]  [facility]     Disposition CSW will continue to follow for placement.  Timmothy Euler. Kaylyn Lim, MSW, LCSWA Disposition Clinical Social Work [phone] (cell) [phone] (office)

## 2018-04-18 NOTE — BH Assessment (Signed)
[facility] Assessment Progress Note    Patient was seen today for re-assessment.  Patient was very pleasant and cooperative.  Patient denies current SI/HI/Psychosis.  However, he appeared to be very anxious, but not agitated.  Patient continues to take part of the medicine ordered for him, but not all of it.  Explained to the patient that is non-compliance with the medication is one of the hold-ups with placement.  Patient states that he will begin to take all the medicine that is prescribed for him in order to make placement easier.  TTS will continue to seek placement for patient.

## 2018-04-19 ENCOUNTER — Encounter (HOSPITAL_COMMUNITY): Payer: Self-pay | Admitting: Registered Nurse

## 2018-04-19 NOTE — BHH Counselor (Signed)
Spoke with Pt's mother, Ms. Sattier -- [phone].  Mother expressed understanding that Pt is calm and compliant with medication.  She expressed willingness for Pt to be discharged to live with her.
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
## 2018-04-19 NOTE — Consult Note (Signed)
  Tele psych Assessment  TTS consult completed. Patient denies suicidal/homicidal ideation, psychosis, and paranoia; and that he has been compliant with his medications Spoke with Kriste BasqueBecky, RN reports that patient has been doing fine and there has been no aggressive behavior or verbal outburst.  Patient took his medication last night and this morning.  States there has only been one aggressive behavioral outburst and it was on a busy day when multiple people being care for in ED some in hallway and noisy which may have trigger the patient on that day.  But patient had been cooperative; some paranoia towards medications because it doesn't look like the medicine he takes at home.   TTS spoke with the mother of patient who states that she has no problem with patient coming home. Patient denies suicidal/homicidal ideation, psychosis, and paranoia.  Patient is compliant wit medications and there has been no aggressive/verbal behavioral outburst.     For detailed note see TTS tele assessment note  Recommendations:  Outpatient psychiatric services.  Disposition:  Patient is psychiatrically cleared  No evidence of imminent risk to self or others at present.   Patient does not meet criteria for psychiatric inpatient admission. Supportive therapy provided about ongoing stressors. Discussed crisis plan, support from social network, calling 911, coming to the Emergency Department, and calling Suicide Hotline.  Spoke with Thayer Ohmhris, RN; informed of above recommendation and disposition.  Dr. Hyacinth MeekerMiller unavailable at this time.  Thayer Ohmhris, RN will inform him of above recommendation and disposition  Assunta FoundShuvon Rankin, NP

## 2018-04-19 NOTE — ED Provider Notes (Signed)
Psych team has re-evaluated pt today: States no inpt criteria at this time, pt can be d/c with outpt resources. Will d/c stable.    Samuel Jester, DO 04/19/18 1335

## 2018-04-19 NOTE — ED Notes (Signed)
Pt requesting something to help him sleep- informed pt that he had two medications ordered- pt agreed to take them. Pt reluctant to take all medication, as there were multiple pills to equal pt scheduled dose. This was explained to pt and pt took medications after explaining this several times.

## 2018-04-19 NOTE — Discharge Instructions (Addendum)
Substance Abuse Treatment Programs ° °Intensive Outpatient Programs °[facility]     °[address]      °[place], [place]                   °[phone]      ° °[facility] °[address] °[place], [place] °[phone] ° °[facility]     °(Inpatient and outpatient)     °[address]           °[phone]   ° °[facility] °[phone] (Suboxone and Methadone) ° °[address]      °[place], [place] [address]      °[phone]      ° °[address] °[place], [place] °[phone] ° °[facility] (Outpatient/Inpatient, Chemical)    °(insurance only) [phone]      °       °[facility] (Groups & Residential) °[place], [place] °[phone] ° °   °[facility]     °[address]     °[place], [place]      °[phone]      ° °[facility] (for caregivers and family) °[address] °[place], [place] °[phone] ° ° ° ° ° °Residential Treatment Programs °[facility]      °[address], [place], [place] [address]  °[phone]      ° °[facility] °[address] [place], [place] [address] °[phone] ° °[facility]        °[phone]      ° °[facility] °[phone] ° °[facility]             °[address]                                         °[place], [place]                                                °[phone] or [phone]                              ° °[facility] [place] °[address] °[place], [address] °[phone] ° °[facility]    °[address]      °, [place]     °[phone]      ° °[facility] °[address] °, [place] °[phone] ° °[facility]   °[address]     °[place], [place] [address]     °[phone]      °Admissions: 8am-3pm M-F ° °[facility] °[address] °[place],  [place] °[phone] ° °[facility]: Residential Program (90 Days)   °[place], [place]      °[phone] or [phone]    ° °[facility] °[place], [place] °(Walk in Hours over the weekend or by referral) ° °[facility] °[address], [place], [place] [address] °([phone] ° °Crisis Mobile: [facility]:  1-877-626-1772 (for crisis response 24 hours a day) °[facility] Hotline:      1-800-256-2452 °Outpatient Psychiatry and Counseling ° °[facility]: Mobile Crisis   Management 24 hours:  1-579-867-5796  [facility] of the [employer] sliding scale fee and walk in schedule: M-F 8am-12pm/1pm-3pm [address]  [place], [place] [address] [place], [place] [address] [phone]  [facility] ([facility])- new patient walk-in appointments available Monday - Friday 8am -3pm.          [address] [place], [place] [address] [phone] or crisis line- [facility]/ Intensive Outpatient Therapy Program [place], [place] [address] [place]      [phone] [place], [place] [address]                 [place]   [facility] [phone]. [place], [place] [address]   [employer] [facility]          [address] Johnette Abraham  [place], [place] [address]       [phone]  [facility] [address] [place], [place] [address] [phone]  [facility]    [address] [place], [place] [address]     [place], [place] Joycelyn [place]     [place] [address]     [phone]       [facility] [place] [address]  [facility]     [address], [place], [place], [place] [address] [place]     [address]     [place], [place] [address]     [phone]       [facility] [address] [place], [place] [address] [phone] Resources for Temporary Residential Assistance/Crisis [facility]) M-F 8am-3pm   [address] [place], [place] [address]   [phone] Services include: laundry, barbering, support groups, case management, phone  & computer access, showers, AA/NA mtgs, mental health/substance abuse nurse, job skills class, disability information, VA assistance, spiritual classes, etc.   [facility]   [address], [place]     [place]              [employer] (women and children)       [place]. [place], [place] [address] [phone] [email or web address] for application and process Application Required  [facility]   [address]    [place] [address]     [phone]                    [place],  [address] [phone](OILNZVJK application appt.) Application Required  [facility] (women only)    [address]     [place],  [address]     [phone]  Intake starts 6pm daily Need valid ID, SSC, & Police report [employer] [address] [place], [place] [phone] Application Required  [facility] (men only)     [address]      [place], [place]     [phone]       [facility] the [place] (Pregnant women only) [address] [place], [place] [phone]  [facility]      [address]      [place], [place] [address]     [phone]             [facility] [address] [place], [place] [phone] 90 day commitment/SA/Application process  [facility](men only)     [address]     [place], [place]     [phone]       Check-in at [facility] of [facility] [address] [place], [place] [address] [phone] Men/Women/Women and Children must be there by 7 pm  [facility] [place], [place] [phone]                   Take your usual prescriptions as previously directed.  Call your regular medical doctor today to schedule a follow up appointment within the next 3 days. Call your regular mental health provider, or a provider on the resources given to you today, to schedule a follow up appointment within the next week.  Return to the Emergency Department immediately sooner if worsening.

## 2018-04-19 NOTE — ED Notes (Signed)
Mother called and can arrive to ED in about 45 min to pick up pt at d/c.

## 2020-02-10 IMAGING — CT CT HEAD W/O CM
4 series · 16 of 47 positions shown, 18 images · non-contrast
Comparison: None.

CLINICAL DATA: Altered level of consciousness (LOC), unexplained

EXAM:
CT HEAD WITHOUT CONTRAST
TECHNIQUE: Contiguous axial images were obtained from the base of the skull
through the vertex without intravenous contrast.

[Series 2: head trauma wo · axial · 0.49mm/px · z∈[+1634,+1734]mm · 7 of 28 slices shown, 9 images]
[im 4/28  brain]
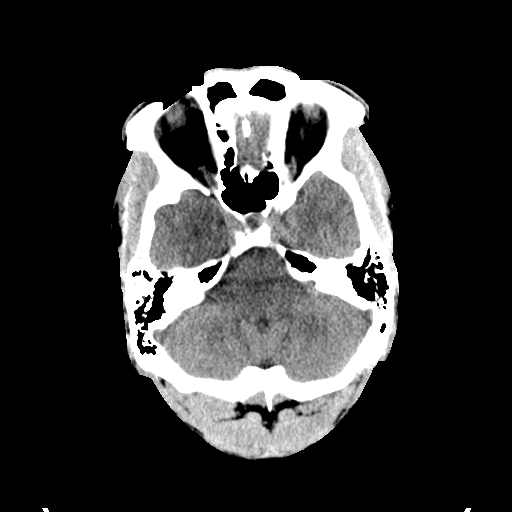
[im 4/28  bone]
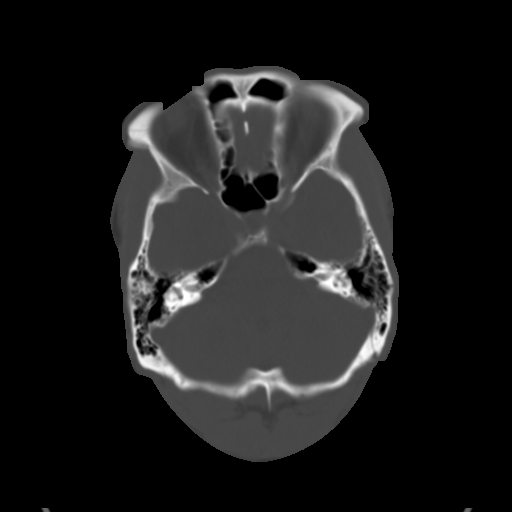
[im 7/28  brain]
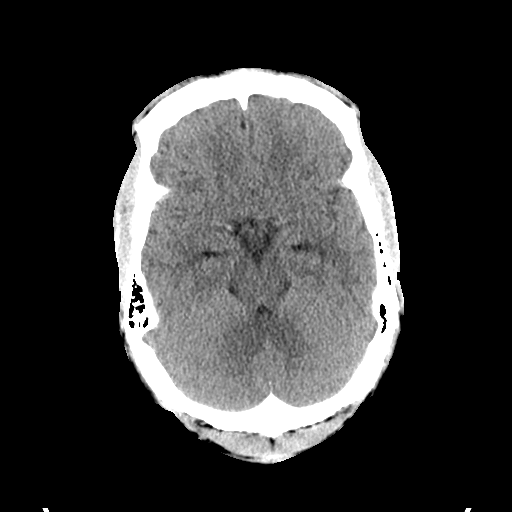
[im 11/28  brain]
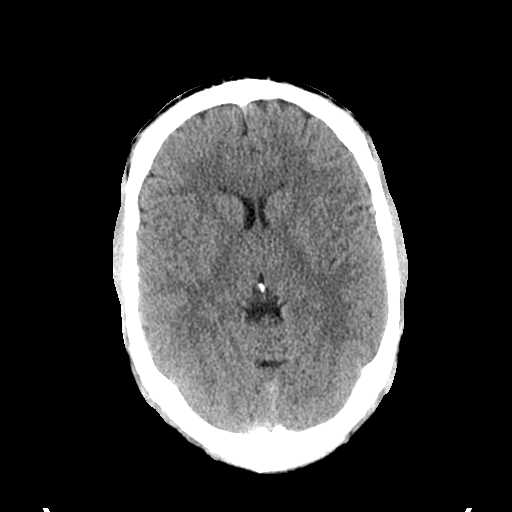
[im 14/28  brain]
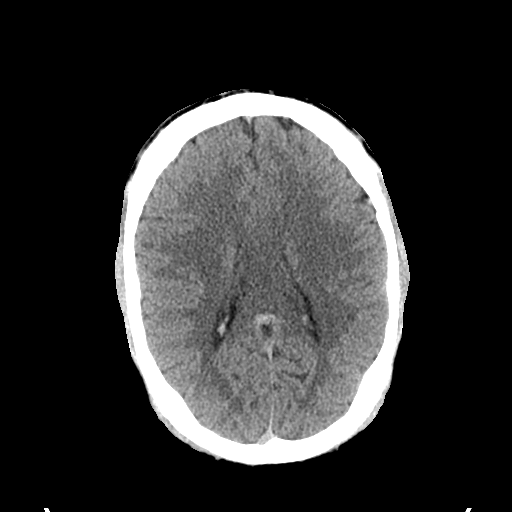
[im 17/28  brain]
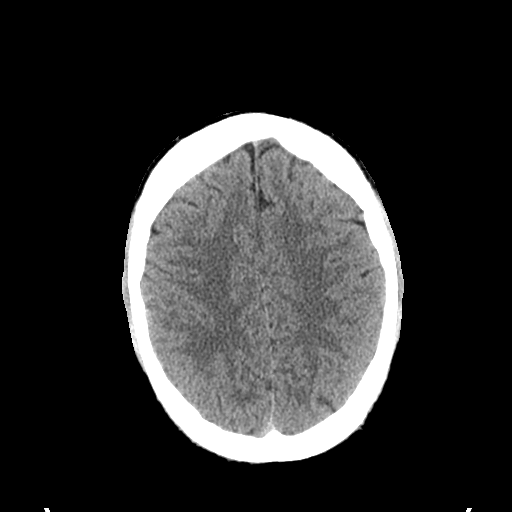
[im 17/28  bone]
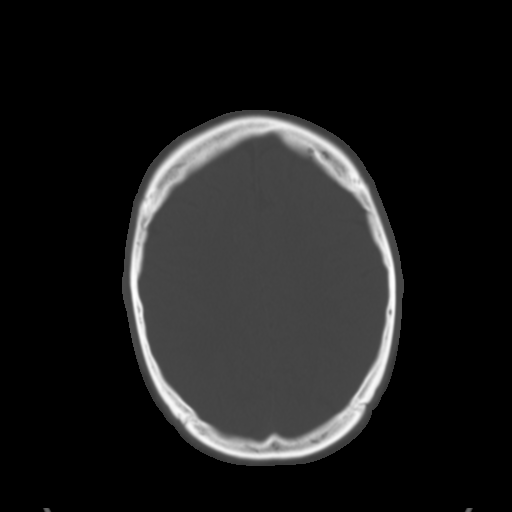
[im 21/28  brain]
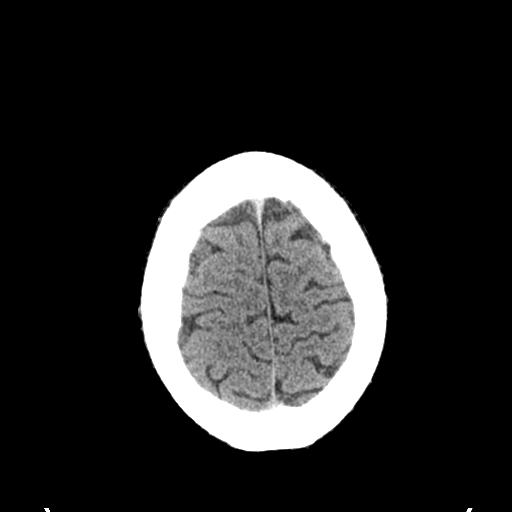
[im 24/28  brain]
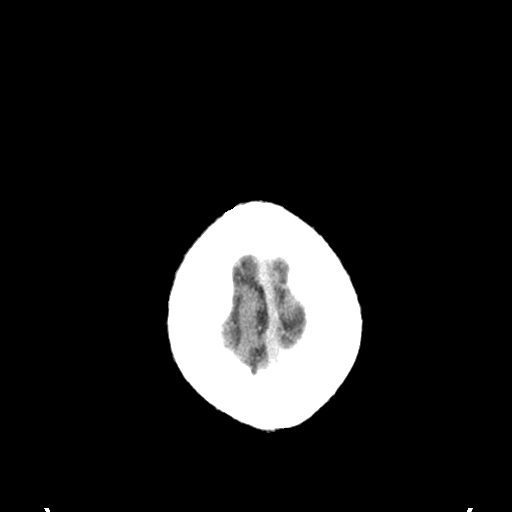

[Series 3: head bone · axial · 0.49mm/px · z∈[+1631,+1659]mm · 3 of 70 slices shown]
[im 7/70  bone]
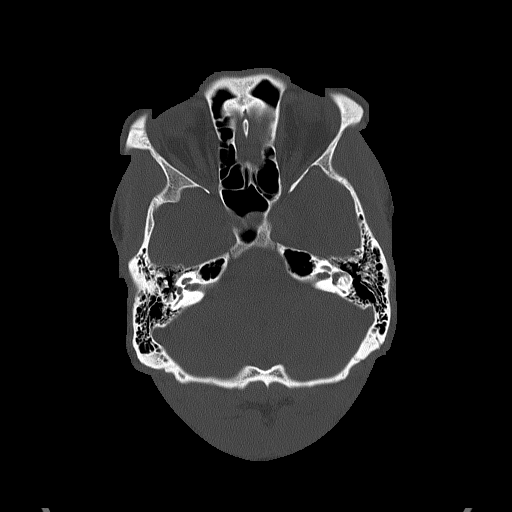
[im 14/70  bone]
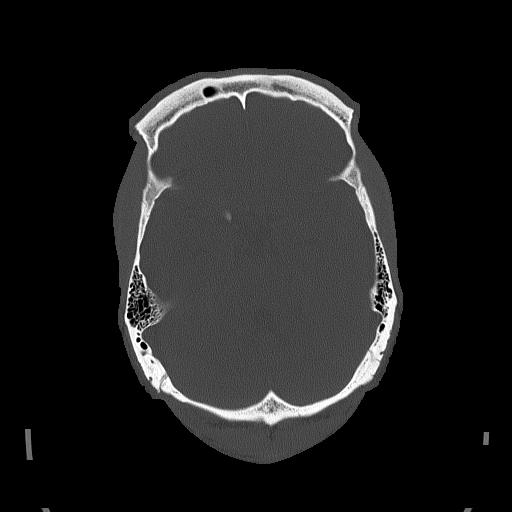
[im 21/70  bone]
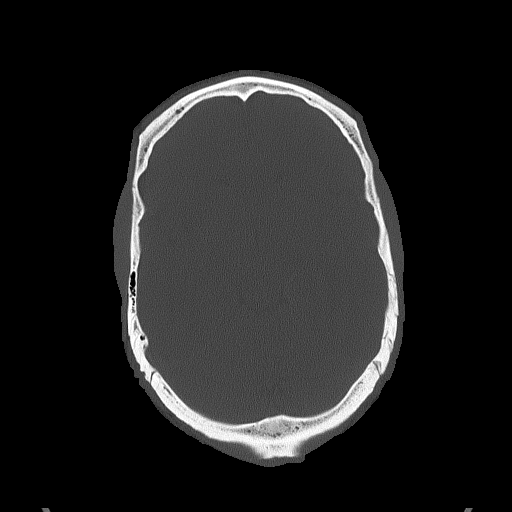

[Series 4: coronal soft tissue · coronal · 0.30mm/px · 3 of 72 slices shown]
[im 24/72  brain]
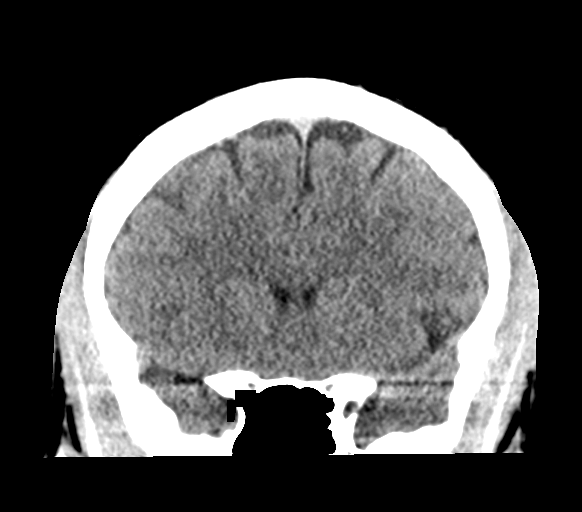
[im 32/72  brain]
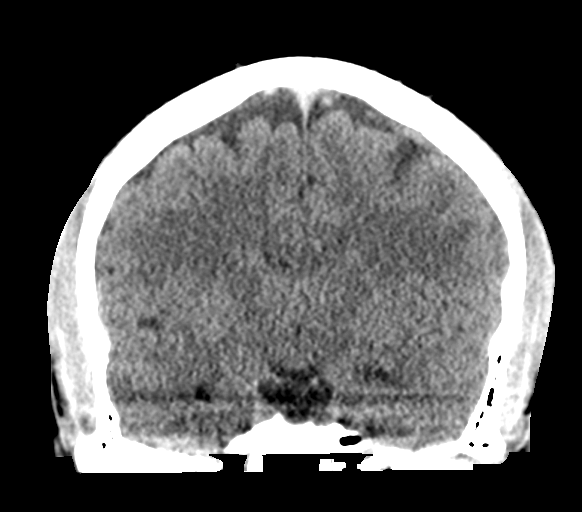
[im 40/72  brain]
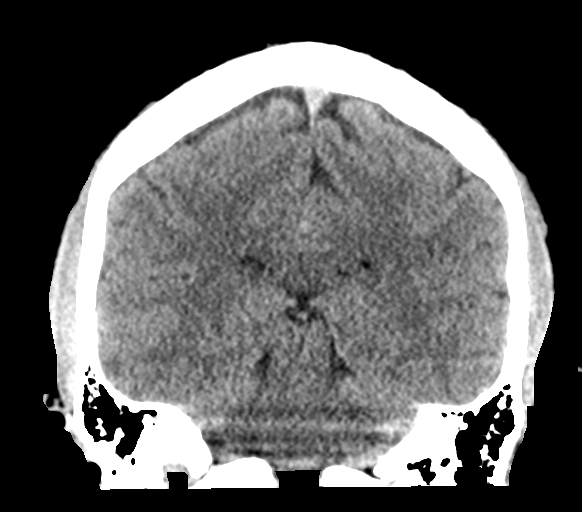

[Series 5: sagittal soft tissue · sagittal · 0.32mm/px · 3 of 57 slices shown]
[im 19/57  brain]
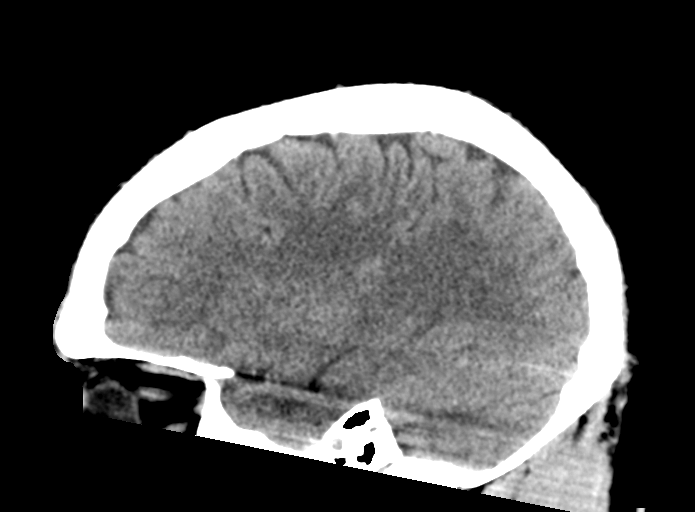
[im 29/57  brain]
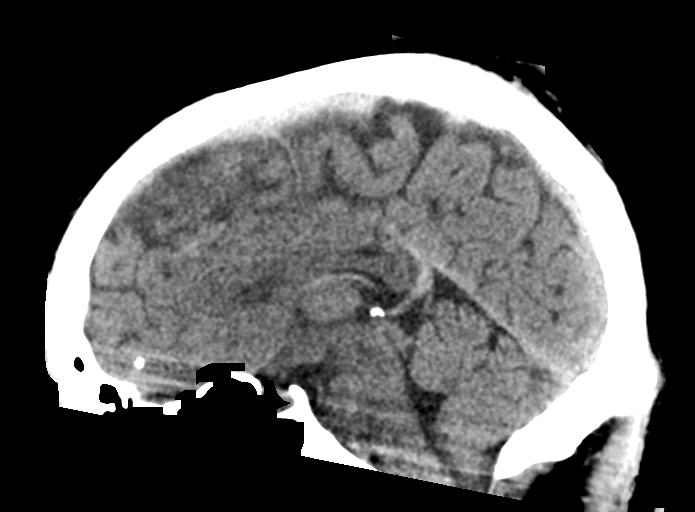
[im 38/57  brain]
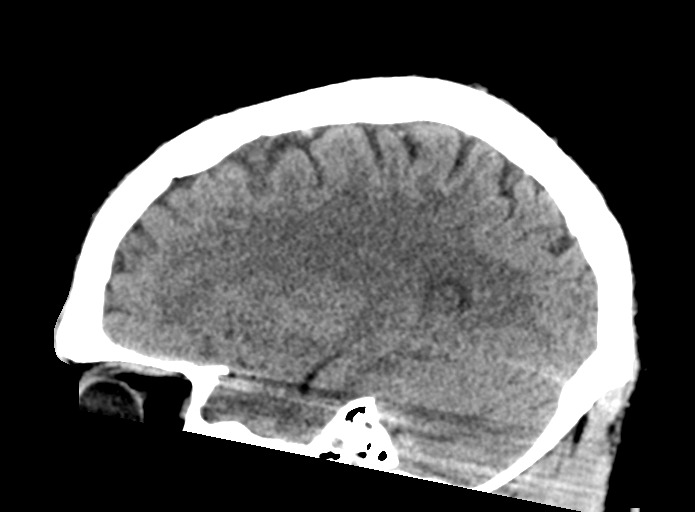

[16 of 47 positions shown; findings below may reference images not displayed]

FINDINGS: Brain: No intracranial hemorrhage, mass effect, or midline shift. No
hydrocephalus. Low lying cerebellar tonsils. No evidence of
territorial infarct or acute ischemia. No extra-axial or
intracranial fluid collection.

Vascular: No hyperdense vessel or unexpected calcification.

Skull: Normal. Negative for fracture or focal lesion.

Sinuses/Orbits: Minimal opacification of right ethmoid air cell.
Paranasal sinuses otherwise clear. Mastoid air cells are
well-aerated. Visualized orbits are unremarkable.

Other: None.
IMPRESSION: No acute intracranial abnormality.
